# Patient Record
Sex: Female | Born: 1965 | Race: White | Hispanic: No | Marital: Married | Smoking: Former smoker
Health system: Southern US, Community
[De-identification: ages and names within clinical notes are randomized; demographics above are authoritative.]

## PROBLEM LIST (undated history)

## (undated) DIAGNOSIS — T7500XA Unspecified effects of lightning, initial encounter: Secondary | ICD-10-CM

## (undated) DIAGNOSIS — A64 Unspecified sexually transmitted disease: Secondary | ICD-10-CM

## (undated) DIAGNOSIS — C4491 Basal cell carcinoma of skin, unspecified: Secondary | ICD-10-CM

## (undated) HISTORY — PX: APPENDECTOMY: SHX54

## (undated) HISTORY — DX: Basal cell carcinoma of skin, unspecified: C44.91

## (undated) HISTORY — PX: OTHER SURGICAL HISTORY: SHX169

## (undated) HISTORY — DX: Unspecified sexually transmitted disease: A64

## (undated) HISTORY — DX: Unspecified effects of lightning, initial encounter: T75.00XA

## (undated) HISTORY — PX: CYST REMOVAL HAND: SHX6279

---
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
## 1990-09-02 HISTORY — PX: OOPHORECTOMY: SHX86

## 2003-04-08 ENCOUNTER — Other Ambulatory Visit: Admission: RE | Admit: 2003-04-08 | Discharge: 2003-04-08 | Payer: Self-pay | Admitting: Gynecology

## 2003-11-22 ENCOUNTER — Other Ambulatory Visit: Admission: RE | Admit: 2003-11-22 | Discharge: 2003-11-22 | Payer: Self-pay | Admitting: Gynecology

## 2004-11-23 ENCOUNTER — Other Ambulatory Visit: Admission: RE | Admit: 2004-11-23 | Discharge: 2004-11-23 | Payer: Self-pay | Admitting: Gynecology

## 2005-11-25 ENCOUNTER — Other Ambulatory Visit: Admission: RE | Admit: 2005-11-25 | Discharge: 2005-11-25 | Payer: Self-pay | Admitting: Gynecology

## 2006-02-06 ENCOUNTER — Ambulatory Visit (HOSPITAL_COMMUNITY): Payer: Self-pay | Admitting: *Deleted

## 2006-06-10 ENCOUNTER — Ambulatory Visit (HOSPITAL_COMMUNITY): Payer: Self-pay | Admitting: *Deleted

## 2006-07-18 ENCOUNTER — Ambulatory Visit (HOSPITAL_COMMUNITY): Payer: Self-pay | Admitting: *Deleted

## 2006-12-03 ENCOUNTER — Other Ambulatory Visit: Admission: RE | Admit: 2006-12-03 | Discharge: 2006-12-03 | Payer: Self-pay | Admitting: Gynecology

## 2007-12-04 ENCOUNTER — Other Ambulatory Visit: Admission: RE | Admit: 2007-12-04 | Discharge: 2007-12-04 | Payer: Self-pay | Admitting: Gynecology

## 2008-12-15 ENCOUNTER — Encounter: Payer: Self-pay | Admitting: Women's Health

## 2008-12-15 ENCOUNTER — Ambulatory Visit: Payer: Self-pay | Admitting: Women's Health

## 2008-12-15 ENCOUNTER — Other Ambulatory Visit: Admission: RE | Admit: 2008-12-15 | Discharge: 2008-12-15 | Payer: Self-pay | Admitting: Gynecology

## 2009-04-02 HISTORY — PX: ANKLE FRACTURE SURGERY: SHX122

## 2009-12-18 ENCOUNTER — Other Ambulatory Visit: Admission: RE | Admit: 2009-12-18 | Discharge: 2009-12-18 | Payer: Self-pay | Admitting: Gynecology

## 2009-12-18 ENCOUNTER — Ambulatory Visit: Payer: Self-pay | Admitting: Women's Health

## 2010-07-03 HISTORY — PX: EYE SURGERY: SHX253

## 2010-12-20 ENCOUNTER — Encounter: Payer: Self-pay | Admitting: Women's Health

## 2010-12-24 ENCOUNTER — Encounter (INDEPENDENT_AMBULATORY_CARE_PROVIDER_SITE_OTHER): Payer: BC Managed Care – PPO | Admitting: Women's Health

## 2010-12-24 ENCOUNTER — Other Ambulatory Visit (HOSPITAL_COMMUNITY)
Admission: RE | Admit: 2010-12-24 | Discharge: 2010-12-24 | Disposition: A | Discharge status: Home / Self Care | Payer: BC Managed Care – PPO | Source: Ambulatory Visit | Attending: Gynecology | Admitting: Gynecology

## 2010-12-24 ENCOUNTER — Other Ambulatory Visit: Payer: Self-pay | Admitting: Women's Health

## 2010-12-24 DIAGNOSIS — Z01419 Encounter for gynecological examination (general) (routine) without abnormal findings: Secondary | ICD-10-CM

## 2010-12-24 DIAGNOSIS — R82998 Other abnormal findings in urine: Secondary | ICD-10-CM

## 2010-12-24 DIAGNOSIS — E079 Disorder of thyroid, unspecified: Secondary | ICD-10-CM

## 2010-12-24 DIAGNOSIS — Z124 Encounter for screening for malignant neoplasm of cervix: Secondary | ICD-10-CM | POA: Insufficient documentation

## 2011-05-17 ENCOUNTER — Telehealth: Payer: Self-pay | Admitting: *Deleted

## 2011-05-17 NOTE — Telephone Encounter (Signed)
Pt called and asked about getting a chickenpox vaccine. She has never had chicken pox and travels the world, her nurse at work advised she get one.  I advised we dont do them here and to call her PCP or the health dept. She understood.

## 2011-07-04 ENCOUNTER — Other Ambulatory Visit: Payer: Self-pay | Admitting: Dermatology

## 2011-11-04 ENCOUNTER — Encounter: Payer: Self-pay | Admitting: Women's Health

## 2011-12-20 ENCOUNTER — Encounter: Payer: Self-pay | Admitting: Women's Health

## 2011-12-20 DIAGNOSIS — T7500XA Unspecified effects of lightning, initial encounter: Secondary | ICD-10-CM | POA: Insufficient documentation

## 2011-12-28 ENCOUNTER — Other Ambulatory Visit: Payer: Self-pay | Admitting: Women's Health

## 2011-12-30 ENCOUNTER — Ambulatory Visit (INDEPENDENT_AMBULATORY_CARE_PROVIDER_SITE_OTHER): Payer: BC Managed Care – PPO | Admitting: Women's Health

## 2011-12-30 ENCOUNTER — Encounter: Payer: Self-pay | Admitting: Women's Health

## 2011-12-30 VITALS — BP 150/94 | Ht 66.5 in | Wt 178.0 lb

## 2011-12-30 DIAGNOSIS — Z309 Encounter for contraceptive management, unspecified: Secondary | ICD-10-CM

## 2011-12-30 DIAGNOSIS — N912 Amenorrhea, unspecified: Secondary | ICD-10-CM

## 2011-12-30 DIAGNOSIS — Z01419 Encounter for gynecological examination (general) (routine) without abnormal findings: Secondary | ICD-10-CM

## 2011-12-30 DIAGNOSIS — N898 Other specified noninflammatory disorders of vagina: Secondary | ICD-10-CM

## 2011-12-30 DIAGNOSIS — IMO0001 Reserved for inherently not codable concepts without codable children: Secondary | ICD-10-CM

## 2011-12-30 DIAGNOSIS — B009 Herpesviral infection, unspecified: Secondary | ICD-10-CM

## 2011-12-30 LAB — WET PREP FOR TRICH, YEAST, CLUE: Clue Cells Wet Prep HPF POC: NONE SEEN

## 2011-12-30 MED ORDER — VALACYCLOVIR HCL 500 MG PO TABS: ORAL_TABLET | ORAL | Status: DC

## 2011-12-30 MED ORDER — FLUCONAZOLE 150 MG PO TABS: 150.0000 mg | ORAL_TABLET | Freq: Once | ORAL | Status: AC

## 2011-12-30 MED ORDER — NORETHINDRONE 0.35 MG PO TABS: 1.0000 | ORAL_TABLET | Freq: Every day | ORAL | Status: DC

## 2011-12-30 NOTE — Progress Notes (Signed)
Ann Hancock 01/23/1966 [id number]    History:    The patient presents for annual exam.  On [facility] [employer] for history of menorrhagia with periods of amenorrhea/husband vasectomy. Has recently had increased hot flushes, and less than 1 day cycle the last few months and last month no bleeding during the placebo week. Has had increased headaches and problems with constipation. Ann Hancock extensively internationally with job at [employer]. History of HSV 1, normal mammograms and Paps.  Past medical history, past surgical history, family history and social history were all reviewed and documented in the EPIC chart. Husband retired [employer]. History of epithelial corneal dystrophy diagnosed in 2010. History of removal of right ovary and fallopian tube for questionable ovarian torsion in 92.  ROS:  A  ROS was performed and pertinent positives and negatives are included in the history.  Exam:  Filed Vitals:   12/30/11 0810  BP: 150/94    General appearance:  Normal Head/Neck:  Normal, without cervical or supraclavicular adenopathy. Thyroid:  Symmetrical, normal in size, without palpable masses or nodularity. Respiratory  Effort:  Normal  Auscultation:  Clear without wheezing or rhonchi Cardiovascular  Auscultation:  Regular rate, without rubs, murmurs or gallops  Edema/varicosities:  Not grossly evident Abdominal  Soft,nontender, without masses, guarding or rebound.  Liver/spleen:  No organomegaly noted  Hernia:  None appreciated  Skin  Inspection:  Grossly normal  Palpation:  Grossly normal Neurologic/psychiatric  Orientation:  Normal with appropriate conversation.  Mood/affect:  Normal  Genitourinary    Breasts: Examined lying and sitting.     Right: Without masses, retractions, discharge or axillary adenopathy.     Left: Without masses, retractions, discharge or axillary adenopathy.   Inguinal/mons:  Normal without inguinal adenopathy  External genitalia:  Normal  BUS/Urethra/Skene's glands:   Normal  Bladder:  Normal  Vagina:  Normal  Cervix:  Normal  Uterus:   normal in size, shape and contour.  Midline and mobile  Adnexa/parametria:     Rt: Without masses or tenderness.   Lt: Without masses or tenderness.  Anus and perineum: Normal  Digital rectal exam: Normal sphincter tone without palpated masses or tenderness  Assessment/Plan:  46 y.o. [employer] G0  for annual exam with complaint of  vaginal itching and headaches.  Amenorrhea on Kariva Blood pressure 150/94 Overweight  Plan: Stop Kariva after current pack, Micronor 1 by mouth daily reviewed no placebo week, instructed to call if problems with bleeding. Instructed to followup with primary care for blood pressure management. Reviewed headaches may be caused by blood pressure elevations. Reviewed importance of decreasing calories, increasing exercise for weight loss. SBE's, annual mammogram, calcium rich diet, vitamin D 1000 daily encouraged. History of all normal Paps, ACOG's current pap recommendations for screenings reviewed. CBC FSH, TSH, UA. Had a normal glucose and lipid panel at work. Wet prep was negative, prescription for Diflucan 150 to take if needed.    Ann Hancock [facility], 10:11 AM 12/30/2011

## 2011-12-30 NOTE — Patient Instructions (Signed)
Hypertension As your heart beats, it forces blood through your arteries. This force is your blood pressure. If the pressure is too high, it is called hypertension (HTN) or high blood pressure. HTN is dangerous because you may have it and not know it. High blood pressure may mean that your heart has to work harder to pump blood. Your arteries may be narrow or stiff. The extra work puts you at risk for heart disease, stroke, and other problems.  Blood pressure consists of two numbers, a higher number over a lower, 110/72, for example. It is stated as "110 over 72." The ideal is below 120 for the top number (systolic) and under 80 for the bottom (diastolic). Write down your blood pressure today. You should pay close attention to your blood pressure if you have certain conditions such as:  Heart failure.   Prior heart attack.   Diabetes   Chronic kidney disease.   Prior stroke.   Multiple risk factors for heart disease.  To see if you have HTN, your blood pressure should be measured while you are seated with your arm held at the level of the heart. It should be measured at least twice. A one-time elevated blood pressure reading (especially in the Emergency Department) does not mean that you need treatment. There may be conditions in which the blood pressure is different between your right and left arms. It is important to see your caregiver soon for a recheck. Most people have essential hypertension which means that there is not a specific cause. This type of high blood pressure may be lowered by changing lifestyle factors such as:  Stress.   Smoking.   Lack of exercise.   Excessive weight.   Drug/tobacco/alcohol use.   Eating less salt.  Most people do not have symptoms from high blood pressure until it has caused damage to the body. Effective treatment can often prevent, delay or reduce that damage. TREATMENT  When a cause has been identified, treatment for high blood pressure is  directed at the cause. There are a large number of medications to treat HTN. These fall into several categories, and your caregiver will help you select the medicines that are best for you. Medications may have side effects. You should review side effects with your caregiver. If your blood pressure stays high after you have made lifestyle changes or started on medicines,   Your medication(s) may need to be changed.   Other problems may need to be addressed.   Be certain you understand your prescriptions, and know how and when to take your medicine.   Be sure to follow up with your caregiver within the time frame advised (usually within two weeks) to have your blood pressure rechecked and to review your medications.   If you are taking more than one medicine to lower your blood pressure, make sure you know how and at what times they should be taken. Taking two medicines at the same time can result in blood pressure that is too low.  SEEK IMMEDIATE MEDICAL CARE IF:  You develop a severe headache, blurred or changing vision, or confusion.   You have unusual weakness or numbness, or a faint feeling.   You have severe chest or abdominal pain, vomiting, or breathing problems.  MAKE SURE YOU:   Understand these instructions.   Will watch your condition.   Will get help right away if you are not doing well or get worse.  Document Released: 08/19/2005 Document Revised: 08/08/2011 Document Reviewed:   04/08/2008 [employer] Patient Information 2012 [place], [place].Health Maintenance, Females A healthy lifestyle and preventative care can promote health and wellness.  Maintain regular health, dental, and eye exams.   Eat a healthy diet. Foods like vegetables, fruits, whole grains, low-fat dairy products, and lean protein foods contain the nutrients you need without too many calories. Decrease your intake of foods high in solid fats, added sugars, and salt. Get information about a proper diet from your  caregiver, if necessary.   Regular physical exercise is one of the most important things you can do for your health. Most adults should get at least 150 minutes of moderate-intensity exercise (any activity that increases your heart rate and causes you to sweat) each week. In addition, most adults need muscle-strengthening exercises on 2 or more days a week.    Maintain a healthy weight. The body mass index (BMI) is a screening tool to identify possible weight problems. It provides an estimate of body fat based on height and weight. Your caregiver can help determine your BMI, and can help you achieve or maintain a healthy weight. For adults 20 years and older:   A BMI below 18.5 is considered underweight.   A BMI of 18.5 to 24.9 is normal.   A BMI of 25 to 29.9 is considered overweight.   A BMI of 30 and above is considered obese.   Maintain normal blood lipids and cholesterol by exercising and minimizing your intake of saturated fat. Eat a balanced diet with plenty of fruits and vegetables. Blood tests for lipids and cholesterol should begin at age 33 and be repeated every 5 years. If your lipid or cholesterol levels are high, you are over 50, or you are a high risk for heart disease, you may need your cholesterol levels checked more frequently.Ongoing high lipid and cholesterol levels should be treated with medicines if diet and exercise are not effective.   If you smoke, find out from your caregiver how to quit. If you do not use tobacco, do not start.   If you are pregnant, do not drink alcohol. If you are breastfeeding, be very cautious about drinking alcohol. If you are not pregnant and choose to drink alcohol, do not exceed 1 drink per day. One drink is considered to be 12 ounces (355 mL) of beer, 5 ounces (148 mL) of wine, or 1.5 ounces (44 mL) of liquor.   Avoid use of street drugs. Do not share needles with anyone. Ask for help if you need support or instructions about stopping the use  of drugs.   High blood pressure causes heart disease and increases the risk of stroke. Blood pressure should be checked at least every 1 to 2 years. Ongoing high blood pressure should be treated with medicines, if weight loss and exercise are not effective.   If you are 26 to 45 years old, ask your caregiver if you should take aspirin to prevent strokes.   Diabetes screening involves taking a blood sample to check your fasting blood sugar level. This should be done once every 3 years, after age 37, if you are within normal weight and without risk factors for diabetes. Testing should be considered at a younger age or be carried out more frequently if you are overweight and have at least 1 risk factor for diabetes.   Breast cancer screening is essential preventative care for women. You should practice "breast self-awareness." This means understanding the normal appearance and feel of your breasts and may include breast  self-examination. Any changes detected, no matter how small, should be reported to a caregiver. Women in their 23s and 30s should have a clinical breast exam (CBE) by a caregiver as part of a regular health exam every 1 to 3 years. After age 31, women should have a CBE every year. Starting at age 39, women should consider having a mammogram (breast X-ray) every year. Women who have a family history of breast cancer should talk to their caregiver about genetic screening. Women at a high risk of breast cancer should talk to their caregiver about having an MRI and a mammogram every year.   The Pap test is a screening test for cervical cancer. Women should have a Pap test starting at age 36. Between ages 60 and 7, Pap tests should be repeated every 2 years. Beginning at age 86, you should have a Pap test every 3 years as long as the past 3 Pap tests have been normal. If you had a hysterectomy for a problem that was not cancer or a condition that could lead to cancer, then you no longer need Pap  tests. If you are between ages 67 and 27, and you have had normal Pap tests going back 10 years, you no longer need Pap tests. If you have had past treatment for cervical cancer or a condition that could lead to cancer, you need Pap tests and screening for cancer for at least 20 years after your treatment. If Pap tests have been discontinued, risk factors (such as a new sexual partner) need to be reassessed to determine if screening should be resumed. Some women have medical problems that increase the chance of getting cervical cancer. In these cases, your caregiver may recommend more frequent screening and Pap tests.   The human papillomavirus (HPV) test is an additional test that may be used for cervical cancer screening. The HPV test looks for the virus that can cause the cell changes on the cervix. The cells collected during the Pap test can be tested for HPV. The HPV test could be used to screen women aged 85 years and older, and should be used in women of any age who have unclear Pap test results. After the age of 29, women should have HPV testing at the same frequency as a Pap test.   Colorectal cancer can be detected and often prevented. Most routine colorectal cancer screening begins at the age of 43 and continues through age 77. However, your caregiver may recommend screening at an earlier age if you have risk factors for colon cancer. On a yearly basis, your caregiver may provide home test kits to check for hidden blood in the stool. Use of a small camera at the end of a tube, to directly examine the colon (sigmoidoscopy or colonoscopy), can detect the earliest forms of colorectal cancer. Talk to your caregiver about this at age 51, when routine screening begins. Direct examination of the colon should be repeated every 5 to 10 years through age 38, unless early forms of pre-cancerous polyps or small growths are found.   Hepatitis C blood testing is recommended for all people born from [birth year] through  1965 and any individual with known risks for hepatitis C.   Practice safe sex. Use condoms and avoid high-risk sexual practices to reduce the spread of sexually transmitted infections (STIs). Sexually active women aged 41 and younger should be checked for Chlamydia, which is a common sexually transmitted infection. Older women with new or multiple partners should also  be tested for Chlamydia. Testing for other STIs is recommended if you are sexually active and at increased risk.   Osteoporosis is a disease in which the bones lose minerals and strength with aging. This can result in serious bone fractures. The risk of osteoporosis can be identified using a bone density scan. Women ages 70 and over and women at risk for fractures or osteoporosis should discuss screening with their caregivers. Ask your caregiver whether you should be taking a calcium supplement or vitamin D to reduce the rate of osteoporosis.   Menopause can be associated with physical symptoms and risks. Hormone replacement therapy is available to decrease symptoms and risks. You should talk to your caregiver about whether hormone replacement therapy is right for you.   Use sunscreen with a sun protection factor (SPF) of 30 or greater. Apply sunscreen liberally and repeatedly throughout the day. You should seek shade when your shadow is shorter than you. Protect yourself by wearing long sleeves, pants, a wide-brimmed hat, and sunglasses year round, whenever you are outdoors.   Notify your caregiver of new moles or changes in moles, especially if there is a change in shape or color. Also notify your caregiver if a mole is larger than the size of a pencil eraser.   Stay current with your immunizations.  Document Released: 03/04/2011 Document Revised: 08/08/2011 Document Reviewed: 03/04/2011 [facility] Patient Information 2012 [place], [place].

## 2011-12-31 LAB — CBC
HCT: 44.4 % (ref 36.0–46.0)
MCV: 96.3 fL (ref 78.0–100.0)
RBC: 4.61 MIL/uL (ref 3.87–5.11)
WBC: 7.3 10*3/uL (ref 4.0–10.5)

## 2012-11-04 ENCOUNTER — Encounter: Payer: Self-pay | Admitting: Women's Health

## 2012-11-12 ENCOUNTER — Other Ambulatory Visit: Payer: Self-pay | Admitting: Women's Health

## 2012-12-30 ENCOUNTER — Ambulatory Visit (INDEPENDENT_AMBULATORY_CARE_PROVIDER_SITE_OTHER): Payer: BC Managed Care – PPO | Admitting: Women's Health

## 2012-12-30 ENCOUNTER — Encounter: Payer: Self-pay | Admitting: Women's Health

## 2012-12-30 ENCOUNTER — Other Ambulatory Visit (HOSPITAL_COMMUNITY)
Admission: RE | Admit: 2012-12-30 | Discharge: 2012-12-30 | Disposition: A | Discharge status: Home / Self Care | Payer: BC Managed Care – PPO | Source: Ambulatory Visit | Attending: Obstetrics and Gynecology | Admitting: Obstetrics and Gynecology

## 2012-12-30 VITALS — BP 122/76 | Ht 66.0 in | Wt 182.0 lb

## 2012-12-30 DIAGNOSIS — Z309 Encounter for contraceptive management, unspecified: Secondary | ICD-10-CM

## 2012-12-30 DIAGNOSIS — Z01419 Encounter for gynecological examination (general) (routine) without abnormal findings: Secondary | ICD-10-CM | POA: Insufficient documentation

## 2012-12-30 DIAGNOSIS — Z833 Family history of diabetes mellitus: Secondary | ICD-10-CM

## 2012-12-30 DIAGNOSIS — C4491 Basal cell carcinoma of skin, unspecified: Secondary | ICD-10-CM | POA: Insufficient documentation

## 2012-12-30 DIAGNOSIS — E079 Disorder of thyroid, unspecified: Secondary | ICD-10-CM

## 2012-12-30 DIAGNOSIS — B009 Herpesviral infection, unspecified: Secondary | ICD-10-CM

## 2012-12-30 DIAGNOSIS — Z13220 Encounter for screening for lipoid disorders: Secondary | ICD-10-CM

## 2012-12-30 DIAGNOSIS — IMO0001 Reserved for inherently not codable concepts without codable children: Secondary | ICD-10-CM

## 2012-12-30 LAB — CBC WITH DIFFERENTIAL/PLATELET
Lymphocytes Relative: 18 % (ref 12–46)
Lymphs Abs: 1.4 10*3/uL (ref 0.7–4.0)
Neutro Abs: 5.4 10*3/uL (ref 1.7–7.7)
Neutrophils Relative %: 68 % (ref 43–77)
Platelets: 280 10*3/uL (ref 150–400)
RBC: 4.67 MIL/uL (ref 3.87–5.11)
WBC: 7.8 10*3/uL (ref 4.0–10.5)

## 2012-12-30 LAB — LIPID PANEL
Cholesterol: 141 mg/dL (ref 0–200)
HDL: 50 mg/dL (ref >39)
Total CHOL/HDL Ratio: 2.8 Ratio
Triglycerides: 91 mg/dL (ref <150)

## 2012-12-30 LAB — COMPREHENSIVE METABOLIC PANEL
ALT: 20 U/L (ref 0–35)
CO2: 27 mEq/L (ref 19–32)
Chloride: 104 mEq/L (ref 96–112)
Sodium: 138 mEq/L (ref 135–145)
Total Bilirubin: 0.5 mg/dL (ref 0.3–1.2)
Total Protein: 6.8 g/dL (ref 6.0–8.3)

## 2012-12-30 MED ORDER — NORETHINDRONE 0.35 MG PO TABS: ORAL_TABLET | ORAL | Status: DC

## 2012-12-30 MED ORDER — VALACYCLOVIR HCL 500 MG PO TABS: ORAL_TABLET | ORAL | Status: DC

## 2012-12-30 NOTE — Patient Instructions (Signed)
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
## 2012-12-31 ENCOUNTER — Telehealth: Payer: Self-pay | Admitting: *Deleted

## 2012-12-31 DIAGNOSIS — IMO0001 Reserved for inherently not codable concepts without codable children: Secondary | ICD-10-CM

## 2012-12-31 LAB — URINALYSIS W MICROSCOPIC + REFLEX CULTURE
Bilirubin Urine: NEGATIVE
Glucose, UA: NEGATIVE mg/dL
Hgb urine dipstick: NEGATIVE
Protein, ur: NEGATIVE mg/dL
Urobilinogen, UA: 0.2 mg/dL (ref 0.0–1.0)

## 2012-12-31 MED ORDER — NORETHINDRONE 0.35 MG PO TABS: ORAL_TABLET | ORAL | Status: DC

## 2012-12-31 NOTE — Telephone Encounter (Signed)
Pt called stating she had no refill on her [name] birth control pills, 11 refills re-sent to pharmacy. Pt informed.

## 2013-05-06 ENCOUNTER — Ambulatory Visit (INDEPENDENT_AMBULATORY_CARE_PROVIDER_SITE_OTHER): Payer: BC Managed Care – PPO | Admitting: Women's Health

## 2013-05-06 ENCOUNTER — Encounter: Payer: Self-pay | Admitting: Women's Health

## 2013-05-06 DIAGNOSIS — N898 Other specified noninflammatory disorders of vagina: Secondary | ICD-10-CM

## 2013-05-06 DIAGNOSIS — R350 Frequency of micturition: Secondary | ICD-10-CM

## 2013-05-06 LAB — URINALYSIS W MICROSCOPIC + REFLEX CULTURE
Bilirubin Urine: NEGATIVE
Glucose, UA: NEGATIVE mg/dL
Hgb urine dipstick: NEGATIVE
Leukocytes, UA: NEGATIVE
Protein, ur: NEGATIVE mg/dL

## 2013-05-06 LAB — WET PREP FOR TRICH, YEAST, CLUE

## 2013-05-06 MED ORDER — MEDROXYPROGESTERONE ACETATE 10 MG PO TABS: ORAL_TABLET | ORAL | Status: DC

## 2013-05-06 NOTE — Progress Notes (Signed)
Patient ID: Ann Hancock, female   DOB: 27-Oct-1965, 47 y.o.   MRN: [id number] Presents with complaint of 3 week cycle on [name], mild external vaginal itching, and increased urinary frequency without pain or burning. History of amenorrhea when not on birth control pills, vasectomy, normal TSH and prolactin. Had blood pressure elevations on combination OCs in the past. Changed from branded Micronor to generic/[name] in April. First time  irregular bleeding. Has had occasional hot flushes.   Exam: Appears well. External genitalia within normal limits, speculum exam scant white discharge without odor or erythema, no blood noted. Wet prep negative, bimanual no CMT or adnexal fullness or tenderness. UA negative. BP 132/80.  Irregular bleeding on [name].  Plan: Stop [name], if becomes amenorrheic, Provera 10 mg for 5 days every 3 months. Instructed to call if irregular bleeding, or no bleeding after Provera.

## 2013-06-29 ENCOUNTER — Telehealth: Payer: Self-pay | Admitting: Women's Health

## 2013-06-29 NOTE — Telephone Encounter (Signed)
Telephone call from patient, had been on Micronor and stopped, was having some irregular spotting. History of amenorrhea for many years on Micronor. Amenorrheic when off OCs. Normal TSH and prolactin. Has had some blood pressure elevations on combination birth control pills in the past. Past week has had heavy bleeding, no more than 1 pad per hour, had been given a prescription for Provera 10 to take for 5 days if no cycle in 60 days. Vasectomy. Will take Provera 10 for 10 days instructed to call if bleeding does not stop.

## 2013-07-08 ENCOUNTER — Other Ambulatory Visit: Payer: Self-pay

## 2013-09-03 ENCOUNTER — Other Ambulatory Visit: Payer: Self-pay | Admitting: Dermatology

## 2013-10-18 ENCOUNTER — Other Ambulatory Visit: Payer: Self-pay

## 2013-10-18 DIAGNOSIS — Z1231 Encounter for screening mammogram for malignant neoplasm of breast: Secondary | ICD-10-CM

## 2013-11-02 ENCOUNTER — Other Ambulatory Visit: Payer: Self-pay | Admitting: Women's Health

## 2013-11-02 ENCOUNTER — Ambulatory Visit
Admission: RE | Admit: 2013-11-02 | Discharge: 2013-11-02 | Disposition: A | Discharge status: Home / Self Care | Payer: BC Managed Care – PPO | Source: Ambulatory Visit

## 2013-11-02 DIAGNOSIS — R928 Other abnormal and inconclusive findings on diagnostic imaging of breast: Secondary | ICD-10-CM

## 2013-11-02 DIAGNOSIS — Z1231 Encounter for screening mammogram for malignant neoplasm of breast: Secondary | ICD-10-CM

## 2013-11-16 ENCOUNTER — Encounter: Payer: Self-pay | Admitting: Women's Health

## 2013-11-16 ENCOUNTER — Ambulatory Visit
Admission: RE | Admit: 2013-11-16 | Discharge: 2013-11-16 | Disposition: A | Discharge status: Home / Self Care | Payer: BC Managed Care – PPO | Source: Ambulatory Visit | Attending: Women's Health | Admitting: Women's Health

## 2013-11-16 DIAGNOSIS — R928 Other abnormal and inconclusive findings on diagnostic imaging of breast: Secondary | ICD-10-CM

## 2013-12-31 ENCOUNTER — Encounter: Payer: Self-pay | Admitting: Women's Health

## 2013-12-31 ENCOUNTER — Other Ambulatory Visit: Payer: Self-pay | Admitting: Women's Health

## 2013-12-31 ENCOUNTER — Ambulatory Visit (INDEPENDENT_AMBULATORY_CARE_PROVIDER_SITE_OTHER): Payer: BC Managed Care – PPO | Admitting: Women's Health

## 2013-12-31 VITALS — BP 140/92 | Ht 66.0 in | Wt 183.0 lb

## 2013-12-31 DIAGNOSIS — B009 Herpesviral infection, unspecified: Secondary | ICD-10-CM

## 2013-12-31 DIAGNOSIS — Z01419 Encounter for gynecological examination (general) (routine) without abnormal findings: Secondary | ICD-10-CM

## 2013-12-31 DIAGNOSIS — Z13220 Encounter for screening for lipoid disorders: Secondary | ICD-10-CM

## 2013-12-31 DIAGNOSIS — N920 Excessive and frequent menstruation with regular cycle: Secondary | ICD-10-CM

## 2013-12-31 LAB — CBC WITH DIFFERENTIAL/PLATELET
Basophils Absolute: 0.1 10*3/uL (ref 0.0–0.1)
Basophils Relative: 1 % (ref 0–1)
EOS PCT: 2 % (ref 0–5)
Eosinophils Absolute: 0.1 10*3/uL (ref 0.0–0.7)
HCT: 42.8 % (ref 36.0–46.0)
HEMOGLOBIN: 14.8 g/dL (ref 12.0–15.0)
LYMPHS ABS: 1.3 10*3/uL (ref 0.7–4.0)
LYMPHS PCT: 19 % (ref 12–46)
MCH: 32.2 pg (ref 26.0–34.0)
MCHC: 34.6 g/dL (ref 30.0–36.0)
MCV: 93 fL (ref 78.0–100.0)
Monocytes Absolute: 0.7 10*3/uL (ref 0.1–1.0)
Monocytes Relative: 11 % (ref 3–12)
Neutro Abs: 4.5 10*3/uL (ref 1.7–7.7)
Neutrophils Relative %: 67 % (ref 43–77)
Platelets: 304 10*3/uL (ref 150–400)
RBC: 4.6 MIL/uL (ref 3.87–5.11)
RDW: 13.3 % (ref 11.5–15.5)
WBC: 6.7 10*3/uL (ref 4.0–10.5)

## 2013-12-31 LAB — COMPREHENSIVE METABOLIC PANEL
ALT: 19 U/L (ref 0–35)
AST: 18 U/L (ref 0–37)
Albumin: 4.4 g/dL (ref 3.5–5.2)
Alkaline Phosphatase: 73 U/L (ref 39–117)
BUN: 8 mg/dL (ref 6–23)
CHLORIDE: 102 meq/L (ref 96–112)
CO2: 29 mEq/L (ref 19–32)
Calcium: 9.2 mg/dL (ref 8.4–10.5)
Creat: 0.76 mg/dL (ref 0.50–1.10)
Glucose, Bld: 87 mg/dL (ref 70–99)
Potassium: 4.1 mEq/L (ref 3.5–5.3)
Sodium: 137 mEq/L (ref 135–145)
TOTAL PROTEIN: 6.8 g/dL (ref 6.0–8.3)
Total Bilirubin: 0.7 mg/dL (ref 0.2–1.2)

## 2013-12-31 LAB — LIPID PANEL
Cholesterol: 150 mg/dL (ref 0–200)
HDL: 59 mg/dL (ref >39)
LDL Cholesterol: 72 mg/dL (ref 0–99)
Total CHOL/HDL Ratio: 2.5 Ratio
Triglycerides: 94 mg/dL (ref <150)
VLDL: 19 mg/dL (ref 0–40)

## 2013-12-31 LAB — TSH: TSH: 2.109 u[IU]/mL (ref 0.350–4.500)

## 2013-12-31 MED ORDER — MEDROXYPROGESTERONE ACETATE 10 MG PO TABS: ORAL_TABLET | ORAL | Status: DC

## 2013-12-31 MED ORDER — NORETHINDRONE 0.35 MG PO TABS: 1.0000 | ORAL_TABLET | Freq: Every day | ORAL | Status: DC

## 2013-12-31 MED ORDER — VALACYCLOVIR HCL 500 MG PO TABS: ORAL_TABLET | ORAL | Status: DC

## 2013-12-31 MED ORDER — MEDROXYPROGESTERONE ACETATE 10 MG PO TABS: 10.0000 mg | ORAL_TABLET | Freq: Every day | ORAL | Status: DC
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
## 2013-12-31 NOTE — Patient Instructions (Signed)

## 2014-01-01 LAB — URINALYSIS W MICROSCOPIC + REFLEX CULTURE
BILIRUBIN URINE: NEGATIVE
CRYSTALS: NONE SEEN
Casts: NONE SEEN
Glucose, UA: NEGATIVE mg/dL
Ketones, ur: NEGATIVE mg/dL
Leukocytes, UA: NEGATIVE
Nitrite: NEGATIVE
PH: 6.5 (ref 5.0–8.0)
Protein, ur: NEGATIVE mg/dL
Specific Gravity, Urine: 1.005 (ref 1.005–1.030)
Squamous Epithelial / LPF: NONE SEEN
Urobilinogen, UA: 0.2 mg/dL (ref 0.0–1.0)

## 2014-01-01 LAB — PROLACTIN: Prolactin: 9 ng/mL

## 2014-01-02 ENCOUNTER — Encounter: Payer: Self-pay | Admitting: Women's Health

## 2014-02-11 ENCOUNTER — Ambulatory Visit: Payer: BC Managed Care – PPO | Admitting: Women's Health

## 2014-02-11 ENCOUNTER — Other Ambulatory Visit: Payer: BC Managed Care – PPO

## 2014-02-16 ENCOUNTER — Other Ambulatory Visit: Payer: BC Managed Care – PPO

## 2014-02-16 ENCOUNTER — Ambulatory Visit: Payer: BC Managed Care – PPO | Admitting: Women's Health

## 2014-10-21 ENCOUNTER — Other Ambulatory Visit: Payer: Self-pay

## 2014-10-21 DIAGNOSIS — Z1231 Encounter for screening mammogram for malignant neoplasm of breast: Secondary | ICD-10-CM

## 2014-11-04 ENCOUNTER — Ambulatory Visit
Admission: RE | Admit: 2014-11-04 | Discharge: 2014-11-04 | Disposition: A | Discharge status: Home / Self Care | Payer: BLUE CROSS/BLUE SHIELD | Source: Ambulatory Visit

## 2014-11-04 DIAGNOSIS — Z1231 Encounter for screening mammogram for malignant neoplasm of breast: Secondary | ICD-10-CM

## 2014-12-05 ENCOUNTER — Other Ambulatory Visit: Payer: Self-pay | Admitting: *Deleted

## 2014-12-05 DIAGNOSIS — B009 Herpesviral infection, unspecified: Secondary | ICD-10-CM

## 2014-12-05 MED ORDER — VALACYCLOVIR HCL 500 MG PO TABS: ORAL_TABLET | ORAL | Status: DC

## 2015-04-21 IMAGING — MG MM SCREENING BREAST TOMO BILATERAL
10 series · 10 of 26 positions shown · non-contrast
Comparison: Previous exam(s).

CLINICAL DATA: Screening.

EXAM:
DIGITAL SCREENING BILATERAL MAMMOGRAM WITH 3D TOMO WITH CAD

[L MLO]
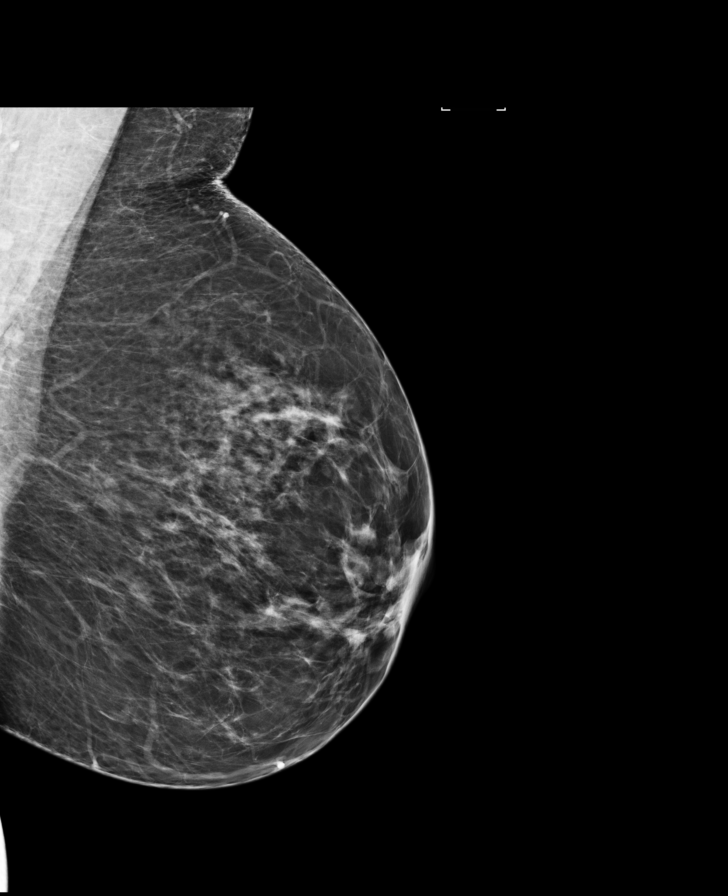

[R MLO]
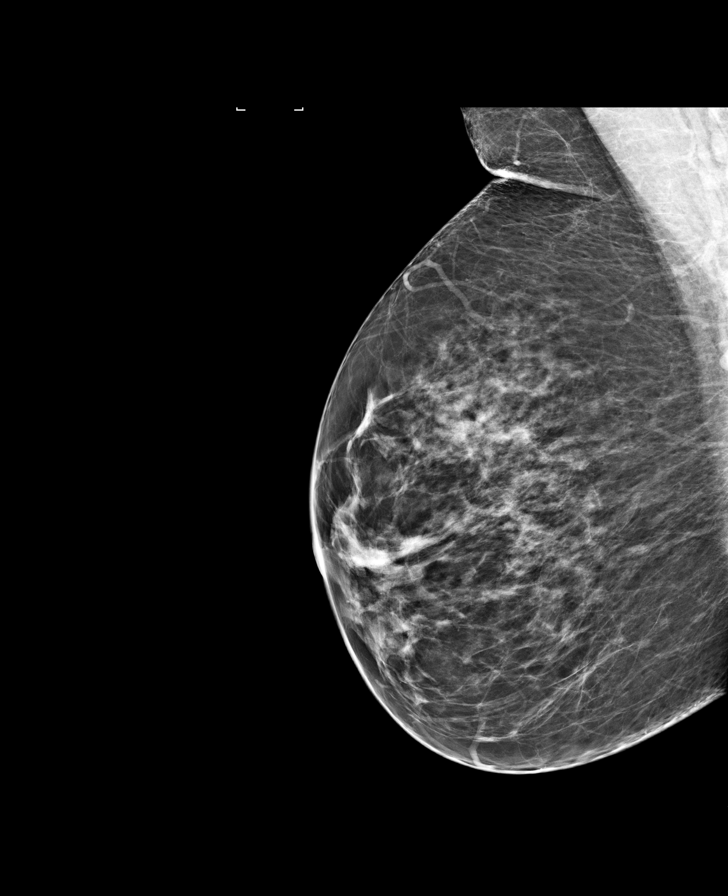

[R CC]
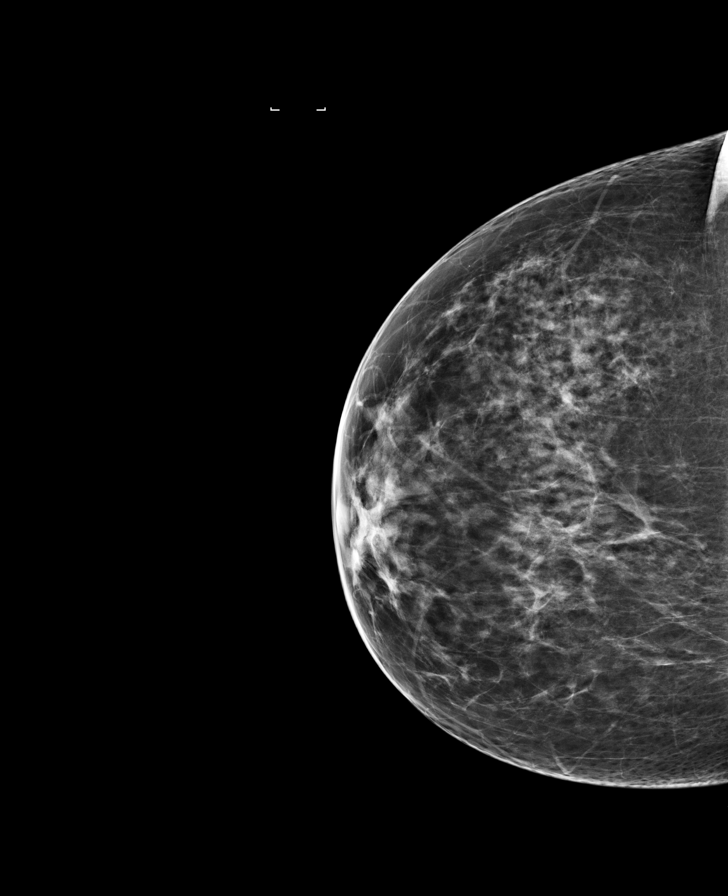

[L CC]
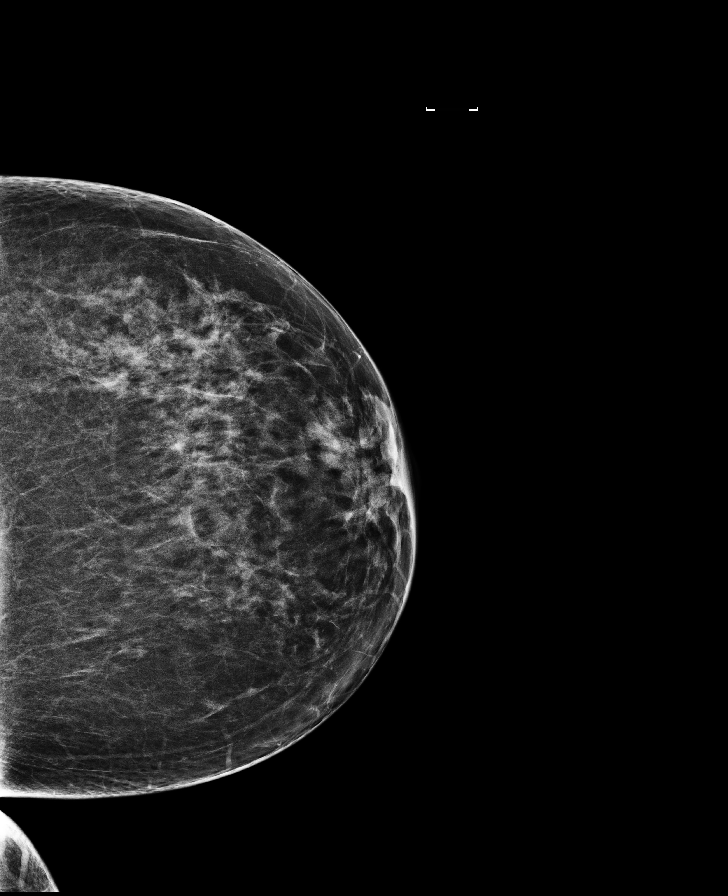

[R CC tomo (1 of 2)]
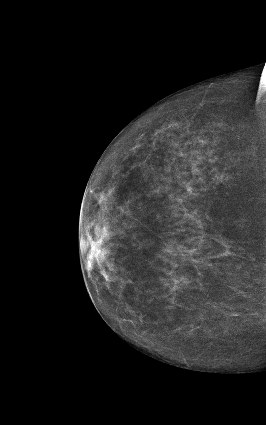

[R MLO tomo (1 of 2)]
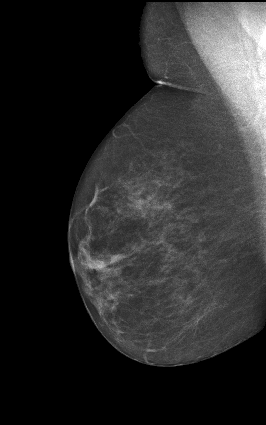

[R MLO tomo (2 of 2) · tomo slice 41/81.0]
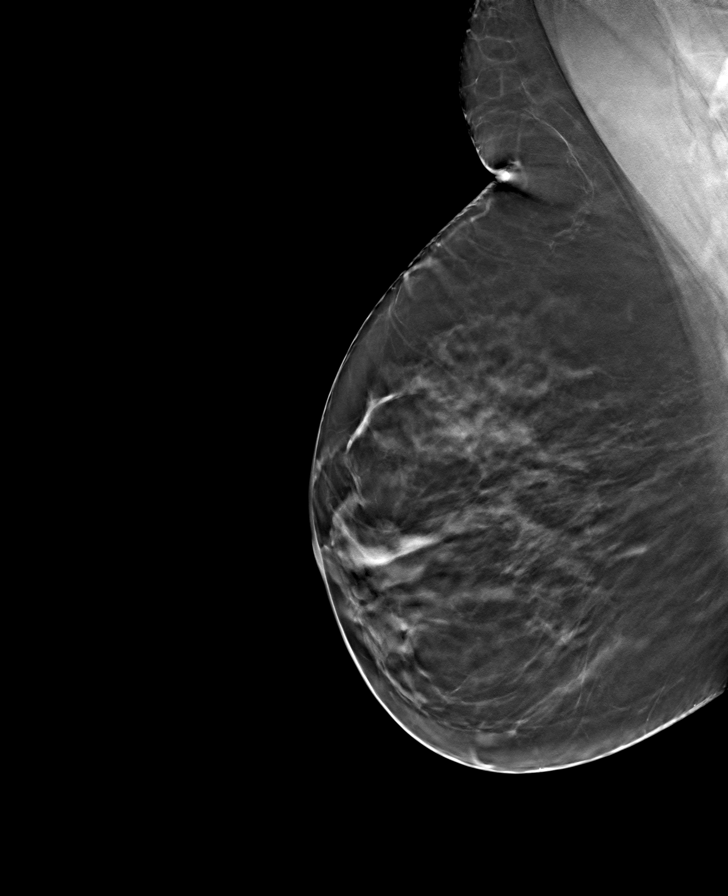

[R CC tomo (2 of 2) · tomo slice 37/72.0]
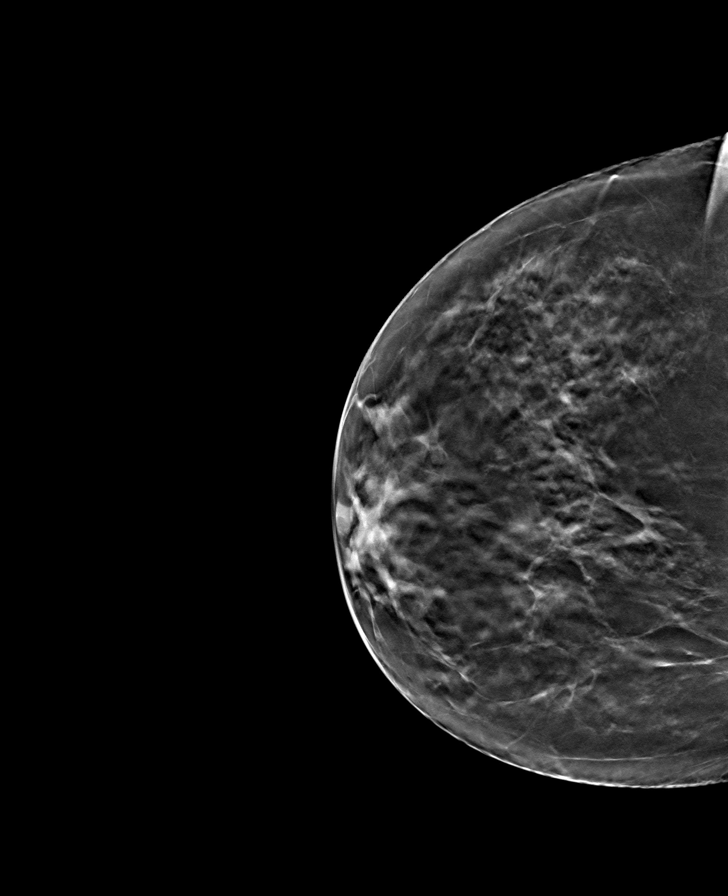

[L CC tomo · tomo slice 29/56.0]
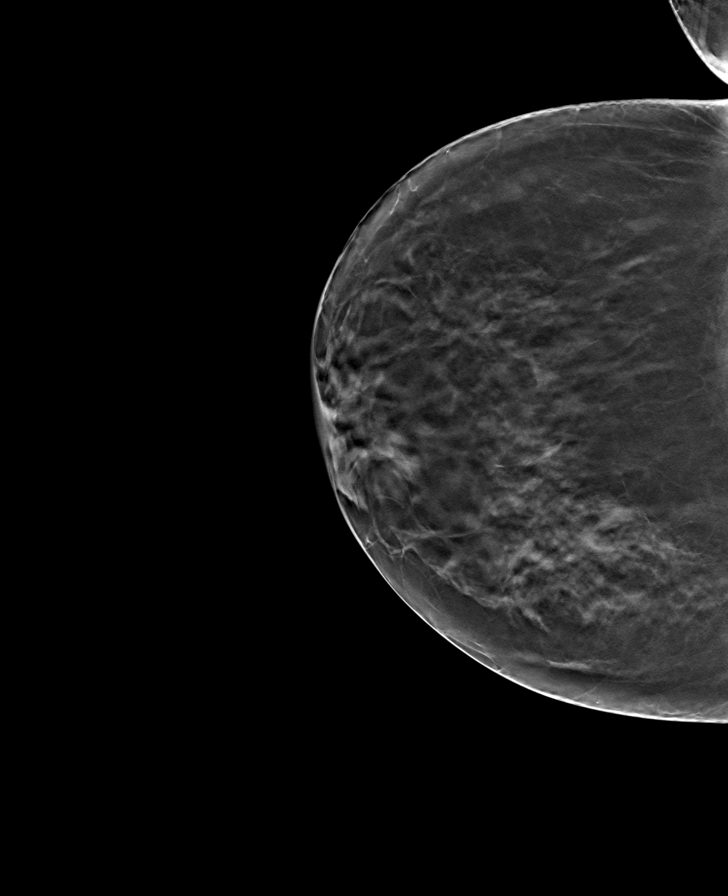

[L MLO tomo · tomo slice 39/78.0]
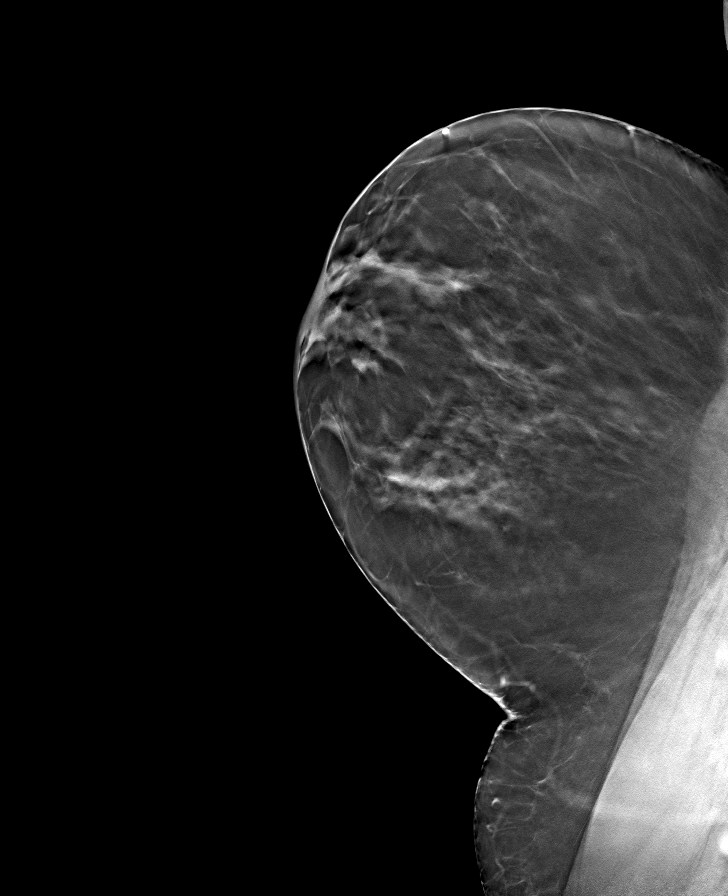

[10 of 26 positions shown; findings below may reference images not displayed]

ACR Breast Density Category c: The breast tissue is heterogeneously
dense, which may obscure small masses.
FINDINGS: There are no findings suspicious for malignancy. Images were
processed with CAD.
IMPRESSION: No mammographic evidence of malignancy. A result letter of this
screening mammogram will be mailed directly to the patient.

RECOMMENDATION:
Screening mammogram in one year. (Code:OA-G-1SS)

BI-RADS CATEGORY  1: Negative.

## 2015-10-10 ENCOUNTER — Encounter: Payer: Self-pay | Admitting: Rehabilitative and Restorative Service Providers"

## 2015-10-10 ENCOUNTER — Ambulatory Visit (INDEPENDENT_AMBULATORY_CARE_PROVIDER_SITE_OTHER): Payer: BLUE CROSS/BLUE SHIELD | Admitting: Rehabilitative and Restorative Service Providers"

## 2015-10-10 DIAGNOSIS — M545 Low back pain, unspecified: Secondary | ICD-10-CM

## 2015-10-10 DIAGNOSIS — R293 Abnormal posture: Secondary | ICD-10-CM

## 2015-10-10 DIAGNOSIS — Z7409 Other reduced mobility: Secondary | ICD-10-CM | POA: Diagnosis not present

## 2015-10-10 DIAGNOSIS — M623 Immobility syndrome (paraplegic): Secondary | ICD-10-CM | POA: Diagnosis not present

## 2015-10-10 DIAGNOSIS — M2560 Stiffness of unspecified joint, not elsewhere classified: Secondary | ICD-10-CM
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
## 2015-10-10 NOTE — Patient Instructions (Signed)
Abdominal Bracing With Pelvic Floor (Hook-Lying)    With neutral spine, tighten pelvic floor and abdominals sucking belly button to back bone; tighten muscles in back at waist. Hold 10 sec. Repeat _10_ times. Do _several__ times a day. Progress to do this exercise sitting/standing/walking and with functional activities.   Trunk: Prone Extension (Press-Ups)    Lie on stomach on firm, flat surface. Relax bottom and legs. Raise chest in air with elbows straight. Keep hips flat on surface, sag stomach. Hold __2-3__ seconds. Repeat ___10_ times. Do __2-3__ sessions per day. CAUTION: Movement should be gentle and slow.  Trunk Extension    Standing, place back of open hands on low back. Straighten spine then arch the back and move shoulders back. Repeat ___2-3_ times per session. Do __4-5 times/day  HIP: Hamstrings - Supine  (can bend opposite knee to protect spine) Place strap around foot. Raise leg up, keeping knee straight.  Bend opposite knee to protect back if indicated. Hold 30 seconds. 3 reps per set, 2-3 sets per day  Knee to chest    Bring one knee toward chest. Hold _20___ seconds. Repeat __3__ times per set. Do __2__ sessions per day.   Piriformis Stretch   Lying on back, pull right knee toward opposite shoulder. Hold 30 seconds. Repeat 3 times. Do 2-3 sessions per day.   Quads / HF, Prone   Lie face down. Grasp one ankle with same-side hand. Use towel if needed to reach. Gently pull foot toward buttock.  Hold 30 seconds. Repeat 3 times per session. Do 2-3 sessions per day.   TENS UNIT: This is helpful for muscle pain and spasm.   Search and Purchase a TENS 7000 2nd edition at www.tenspros.com. It should be less than $30.     TENS unit instructions: Do not shower or bathe with the unit on Turn the unit off before removing electrodes or batteries If the electrodes lose stickiness add a drop of water to the electrodes after they are disconnected from  the unit and place on plastic sheet. If you continued to have difficulty, call the TENS unit company to purchase more electrodes. Do not apply lotion on the skin area prior to use. Make sure the skin is clean and dry as this will help prolong the life of the electrodes. After use, always check skin for unusual red areas, rash or other skin difficulties. If there are any skin problems, does not apply electrodes to the same area. Never remove the electrodes from the unit by pulling the wires. Do not use the TENS unit or electrodes other than as directed. Do not change electrode placement without consultating your therapist or physician. Keep 2 fingers with between each electrode.

## 2015-10-12 ENCOUNTER — Ambulatory Visit (INDEPENDENT_AMBULATORY_CARE_PROVIDER_SITE_OTHER): Payer: BLUE CROSS/BLUE SHIELD | Admitting: Physical Therapy

## 2015-10-12 ENCOUNTER — Encounter: Payer: Self-pay | Admitting: Physical Therapy

## 2015-10-12 DIAGNOSIS — Z7409 Other reduced mobility: Secondary | ICD-10-CM | POA: Diagnosis not present

## 2015-10-12 DIAGNOSIS — R293 Abnormal posture: Secondary | ICD-10-CM

## 2015-10-12 DIAGNOSIS — M623 Immobility syndrome (paraplegic): Secondary | ICD-10-CM | POA: Diagnosis not present

## 2015-10-12 DIAGNOSIS — M2560 Stiffness of unspecified joint, not elsewhere classified: Secondary | ICD-10-CM

## 2015-10-12 DIAGNOSIS — M545 Low back pain, unspecified: Secondary | ICD-10-CM
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
## 2015-10-12 NOTE — Patient Instructions (Signed)

## 2015-10-17 ENCOUNTER — Ambulatory Visit (INDEPENDENT_AMBULATORY_CARE_PROVIDER_SITE_OTHER): Payer: BLUE CROSS/BLUE SHIELD | Admitting: Rehabilitative and Restorative Service Providers"

## 2015-10-17 ENCOUNTER — Encounter: Payer: Self-pay | Admitting: Rehabilitative and Restorative Service Providers"

## 2015-10-17 DIAGNOSIS — M545 Low back pain, unspecified: Secondary | ICD-10-CM

## 2015-10-17 DIAGNOSIS — R293 Abnormal posture: Secondary | ICD-10-CM

## 2015-10-17 DIAGNOSIS — M2560 Stiffness of unspecified joint, not elsewhere classified: Secondary | ICD-10-CM

## 2015-10-17 DIAGNOSIS — M623 Immobility syndrome (paraplegic): Secondary | ICD-10-CM

## 2015-10-17 DIAGNOSIS — Z7409 Other reduced mobility: Secondary | ICD-10-CM | POA: Diagnosis not present
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
## 2015-10-17 NOTE — Patient Instructions (Signed)
Angry Cat Stretch    Tuck chin and tighten stomach, arching back. Repeat __5__ times per set. Do __1-2__ sets per session. Do _2-3___ sessions per day.    Combination (Hook-Lying)    Tighten stomach and slowly raise left leg and lower opposite arm over head. Keep trunk rigid. Repeat _10___ times per set. Do __2-3__ sessions per day.  Piriformis Stretch    Lying on back, pull right knee toward opposite shoulder. Hold _20-30___ seconds. Repeat __3__ times. Do __2-3__ sessions per day.   HIP: Hamstrings - Supine    Place strap around foot. Raise leg up, keep knee straight. Hold __30_ seconds. _3__ reps per set, __2-3_ times per day

## 2015-10-19 ENCOUNTER — Ambulatory Visit (INDEPENDENT_AMBULATORY_CARE_PROVIDER_SITE_OTHER): Payer: BLUE CROSS/BLUE SHIELD | Admitting: Physical Therapy

## 2015-10-19 ENCOUNTER — Encounter: Payer: Self-pay | Admitting: Physical Therapy

## 2015-10-19 DIAGNOSIS — R293 Abnormal posture: Secondary | ICD-10-CM | POA: Diagnosis not present

## 2015-10-19 DIAGNOSIS — M545 Low back pain, unspecified: Secondary | ICD-10-CM

## 2015-10-19 DIAGNOSIS — M623 Immobility syndrome (paraplegic): Secondary | ICD-10-CM | POA: Diagnosis not present

## 2015-10-19 DIAGNOSIS — Z7409 Other reduced mobility: Secondary | ICD-10-CM | POA: Diagnosis not present

## 2015-10-19 DIAGNOSIS — M2560 Stiffness of unspecified joint, not elsewhere classified: Secondary | ICD-10-CM
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
## 2015-10-19 NOTE — Patient Instructions (Signed)
Adductor Stretch: Reclined (Strap, Wall)    Warm up with leg vertical. Rotate leg to side and fix foot to wall. Anchor opposite hip. Hold for __10__ breaths. Repeat _1-2___ times each leg.   Outer Hip Stretch: Reclined IT Band Stretch (Strap)    Strap around opposite foot, pull across only as far as possible with shoulders on mat. Hold for _10___ breaths. Repeat __1-2__ times each leg.  Copyright  VHI. All rights reserved.

## 2015-10-23 ENCOUNTER — Ambulatory Visit (INDEPENDENT_AMBULATORY_CARE_PROVIDER_SITE_OTHER): Payer: BLUE CROSS/BLUE SHIELD | Admitting: Physical Therapy

## 2015-10-23 DIAGNOSIS — M545 Low back pain, unspecified: Secondary | ICD-10-CM

## 2015-10-23 DIAGNOSIS — Z7409 Other reduced mobility: Secondary | ICD-10-CM

## 2015-10-23 DIAGNOSIS — M623 Immobility syndrome (paraplegic): Secondary | ICD-10-CM

## 2015-10-23 DIAGNOSIS — R293 Abnormal posture: Secondary | ICD-10-CM | POA: Diagnosis not present

## 2015-10-23 DIAGNOSIS — M2560 Stiffness of unspecified joint, not elsewhere classified: Secondary | ICD-10-CM

## 2015-10-23 NOTE — Patient Instructions (Signed)
  Abdominal Bracing With Pelvic Floor (Hook-Lying)   With neutral spine, tighten pelvic floor and abdominals. Hold 10 seconds. Repeat __10_ times. Do _1__ times a day.   Knee to Chest: Transverse Plane Stability   Bring one knee up, then return. Be sure pelvis does not roll side to side. Keep pelvis still. Lift knee __10_ times each leg. Restabilize pelvis. Repeat with other leg. Do _1-2__ sets, _1__ times per day.    Hip External Rotation With Pillow: Transverse Plane Stability   One knee bent, one leg straight, on pillow. Slowly roll bent knee out. Be sure pelvis does not rotate. Do _10__ times. Restabilize pelvis. Repeat with other leg. Do _1-2__ sets, _1__ times per day.  Heel Slide: 4-10 Inches - Transverse Plane Stability   Slide heel 4 inches down. Be sure pelvis does not rotate. Do _10__ times. Restabilize pelvis. Repeat with other leg. Do __1_ sets, _1__ times per day.  Seated Alternating Leg Raise (Marching)    Sit on ball/chair. Engage core muscles. Raise bent knee and return. Repeat with other leg. Do 21__ sets of _10__ repetitions.  [facility] [place], [place] [address]  [phone] (office) [phone] (fax)
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
## 2015-10-26 ENCOUNTER — Encounter: Payer: Self-pay | Admitting: Physical Therapy

## 2015-10-26 ENCOUNTER — Ambulatory Visit (INDEPENDENT_AMBULATORY_CARE_PROVIDER_SITE_OTHER): Payer: BLUE CROSS/BLUE SHIELD | Admitting: Physical Therapy

## 2015-10-26 DIAGNOSIS — Z7409 Other reduced mobility: Secondary | ICD-10-CM | POA: Diagnosis not present

## 2015-10-26 DIAGNOSIS — M623 Immobility syndrome (paraplegic): Secondary | ICD-10-CM

## 2015-10-26 DIAGNOSIS — M545 Low back pain, unspecified: Secondary | ICD-10-CM

## 2015-10-26 DIAGNOSIS — R293 Abnormal posture: Secondary | ICD-10-CM

## 2015-10-26 DIAGNOSIS — M2560 Stiffness of unspecified joint, not elsewhere classified: Secondary | ICD-10-CM
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
## 2015-10-31 ENCOUNTER — Encounter: Payer: BLUE CROSS/BLUE SHIELD | Admitting: Physical Therapy

## 2015-11-02 ENCOUNTER — Ambulatory Visit (INDEPENDENT_AMBULATORY_CARE_PROVIDER_SITE_OTHER): Payer: BLUE CROSS/BLUE SHIELD | Admitting: Physical Therapy

## 2015-11-02 DIAGNOSIS — R293 Abnormal posture: Secondary | ICD-10-CM | POA: Diagnosis not present

## 2015-11-02 DIAGNOSIS — M545 Low back pain, unspecified: Secondary | ICD-10-CM

## 2015-11-02 DIAGNOSIS — Z7409 Other reduced mobility: Secondary | ICD-10-CM | POA: Diagnosis not present

## 2015-11-02 DIAGNOSIS — M623 Immobility syndrome (paraplegic): Secondary | ICD-10-CM | POA: Diagnosis not present

## 2015-11-02 DIAGNOSIS — M2560 Stiffness of unspecified joint, not elsewhere classified: Secondary | ICD-10-CM
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
## 2015-11-06 ENCOUNTER — Ambulatory Visit (INDEPENDENT_AMBULATORY_CARE_PROVIDER_SITE_OTHER): Payer: BLUE CROSS/BLUE SHIELD | Admitting: Physical Therapy

## 2015-11-06 DIAGNOSIS — Z7409 Other reduced mobility: Secondary | ICD-10-CM | POA: Diagnosis not present

## 2015-11-06 DIAGNOSIS — M2560 Stiffness of unspecified joint, not elsewhere classified: Secondary | ICD-10-CM

## 2015-11-06 DIAGNOSIS — M623 Immobility syndrome (paraplegic): Secondary | ICD-10-CM | POA: Diagnosis not present

## 2015-11-06 DIAGNOSIS — M545 Low back pain, unspecified: Secondary | ICD-10-CM

## 2015-11-06 DIAGNOSIS — R293 Abnormal posture: Secondary | ICD-10-CM | POA: Diagnosis not present
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
## 2015-11-09 ENCOUNTER — Encounter: Payer: BLUE CROSS/BLUE SHIELD | Admitting: Physical Therapy

## 2015-11-13 ENCOUNTER — Encounter: Payer: BLUE CROSS/BLUE SHIELD | Admitting: Rehabilitative and Restorative Service Providers"

## 2016-04-10 ENCOUNTER — Encounter: Payer: Self-pay | Admitting: Women's Health

## 2016-04-17 ENCOUNTER — Ambulatory Visit (INDEPENDENT_AMBULATORY_CARE_PROVIDER_SITE_OTHER): Payer: BLUE CROSS/BLUE SHIELD | Admitting: Women's Health

## 2016-04-17 ENCOUNTER — Encounter: Payer: Self-pay | Admitting: Women's Health

## 2016-04-17 VITALS — BP 140/86 | Ht 66.0 in | Wt 190.0 lb

## 2016-04-17 DIAGNOSIS — B009 Herpesviral infection, unspecified: Secondary | ICD-10-CM

## 2016-04-17 DIAGNOSIS — N898 Other specified noninflammatory disorders of vagina: Secondary | ICD-10-CM

## 2016-04-17 DIAGNOSIS — N951 Menopausal and female climacteric states: Secondary | ICD-10-CM | POA: Diagnosis not present

## 2016-04-17 DIAGNOSIS — Z01419 Encounter for gynecological examination (general) (routine) without abnormal findings: Secondary | ICD-10-CM

## 2016-04-17 DIAGNOSIS — Z23 Encounter for immunization: Secondary | ICD-10-CM | POA: Diagnosis not present

## 2016-04-17 LAB — WET PREP FOR TRICH, YEAST, CLUE
CLUE CELLS WET PREP: NONE SEEN
Trich, Wet Prep: NONE SEEN
WBC, Wet Prep HPF POC: NONE SEEN
YEAST WET PREP: NONE SEEN

## 2016-04-17 MED ORDER — MEDROXYPROGESTERONE ACETATE 10 MG PO TABS: ORAL_TABLET | ORAL | 4 refills | Status: DC

## 2016-04-17 MED ORDER — VALACYCLOVIR HCL 500 MG PO TABS: ORAL_TABLET | ORAL | 4 refills | Status: DC
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
## 2016-04-17 NOTE — Patient Instructions (Signed)
Health Maintenance, Female Adopting a healthy lifestyle and getting preventive care can go a long way to promote health and wellness. Talk with your health care provider about what schedule of regular examinations is right for you. This is a good chance for you to check in with your provider about disease prevention and staying healthy. In between checkups, there are plenty of things you can do on your own. Experts have done a lot of research about which lifestyle changes and preventive measures are most likely to keep you healthy. Ask your health care provider for more information. WEIGHT AND DIET  Eat a healthy diet  Be sure to include plenty of vegetables, fruits, low-fat dairy products, and lean protein.  Do not eat a lot of foods high in solid fats, added sugars, or salt.  Get regular exercise. This is one of the most important things you can do for your health.  Most adults should exercise for at least 150 minutes each week. The exercise should increase your heart rate and make you sweat (moderate-intensity exercise).  Most adults should also do strengthening exercises at least twice a week. This is in addition to the moderate-intensity exercise.  Maintain a healthy weight  Body mass index (BMI) is a measurement that can be used to identify possible weight problems. It estimates body fat based on height and weight. Your health care provider can help determine your BMI and help you achieve or maintain a healthy weight.  For females 20 years of age and older:   A BMI below 18.5 is considered underweight.  A BMI of 18.5 to 24.9 is normal.  A BMI of 25 to 29.9 is considered overweight.  A BMI of 30 and above is considered obese.  Watch levels of cholesterol and blood lipids  You should start having your blood tested for lipids and cholesterol at 49 years of age, then have this test every 5 years.  You may need to have your cholesterol levels checked more often if:  Your lipid  or cholesterol levels are high.  You are older than 50 years of age.  You are at high risk for heart disease.  CANCER SCREENING   Lung Cancer  Lung cancer screening is recommended for adults 55-80 years old who are at high risk for lung cancer because of a history of smoking.  A yearly low-dose CT scan of the lungs is recommended for people who:  Currently smoke.  Have quit within the past 15 years.  Have at least a 30-pack-year history of smoking. A pack year is smoking an average of one pack of cigarettes a day for 1 year.  Yearly screening should continue until it has been 15 years since you quit.  Yearly screening should stop if you develop a health problem that would prevent you from having lung cancer treatment.  Breast Cancer  Practice breast self-awareness. This means understanding how your breasts normally appear and feel.  It also means doing regular breast self-exams. Let your health care provider know about any changes, no matter how small.  If you are in your 20s or 30s, you should have a clinical breast exam (CBE) by a health care provider every 1-3 years as part of a regular health exam.  If you are 40 or older, have a CBE every year. Also consider having a breast X-ray (mammogram) every year.  If you have a family history of breast cancer, talk to your health care provider about genetic screening.  If you   are at high risk for breast cancer, talk to your health care provider about having an MRI and a mammogram every year.  Breast cancer gene (BRCA) assessment is recommended for women who have family members with BRCA-related cancers. BRCA-related cancers include:  Breast.  Ovarian.  Tubal.  Peritoneal cancers.  Results of the assessment will determine the need for genetic counseling and BRCA1 and BRCA2 testing. Cervical Cancer Your health care provider may recommend that you be screened regularly for cancer of the pelvic organs (ovaries, uterus, and  vagina). This screening involves a pelvic examination, including checking for microscopic changes to the surface of your cervix (Pap test). You may be encouraged to have this screening done every 3 years, beginning at age 21.  For women ages 30-65, health care providers may recommend pelvic exams and Pap testing every 3 years, or they may recommend the Pap and pelvic exam, combined with testing for human papilloma virus (HPV), every 5 years. Some types of HPV increase your risk of cervical cancer. Testing for HPV may also be done on women of any age with unclear Pap test results.  Other health care providers may not recommend any screening for nonpregnant women who are considered low risk for pelvic cancer and who do not have symptoms. Ask your health care provider if a screening pelvic exam is right for you.  If you have had past treatment for cervical cancer or a condition that could lead to cancer, you need Pap tests and screening for cancer for at least 20 years after your treatment. If Pap tests have been discontinued, your risk factors (such as having a new sexual partner) need to be reassessed to determine if screening should resume. Some women have medical problems that increase the chance of getting cervical cancer. In these cases, your health care provider may recommend more frequent screening and Pap tests. Colorectal Cancer  This type of cancer can be detected and often prevented.  Routine colorectal cancer screening usually begins at 50 years of age and continues through 49 years of age.  Your health care provider may recommend screening at an earlier age if you have risk factors for colon cancer.  Your health care provider may also recommend using home test kits to check for hidden blood in the stool.  A small camera at the end of a tube can be used to examine your colon directly (sigmoidoscopy or colonoscopy). This is done to check for the earliest forms of colorectal  cancer.  Routine screening usually begins at age 50.  Direct examination of the colon should be repeated every 5-10 years through 49 years of age. However, you may need to be screened more often if early forms of precancerous polyps or small growths are found. Skin Cancer  Check your skin from head to toe regularly.  Tell your health care provider about any new moles or changes in moles, especially if there is a change in a mole's shape or color.  Also tell your health care provider if you have a mole that is larger than the size of a pencil eraser.  Always use sunscreen. Apply sunscreen liberally and repeatedly throughout the day.  Protect yourself by wearing long sleeves, pants, a wide-brimmed hat, and sunglasses whenever you are outside. HEART DISEASE, DIABETES, AND HIGH BLOOD PRESSURE   High blood pressure causes heart disease and increases the risk of stroke. High blood pressure is more likely to develop in:  People who have blood pressure in the high end   of the normal range (130-139/85-89 mm Hg).  People who are overweight or obese.  People who are African American.  If you are 38-23 years of age, have your blood pressure checked every 3-5 years. If you are 61 years of age or older, have your blood pressure checked every year. You should have your blood pressure measured twice--once when you are at a hospital or clinic, and once when you are not at a hospital or clinic. Record the average of the two measurements. To check your blood pressure when you are not at a hospital or clinic, you can use:  An automated blood pressure machine at a pharmacy.  A home blood pressure monitor.  If you are between 45 years and 39 years old, ask your health care provider if you should take aspirin to prevent strokes.  Have regular diabetes screenings. This involves taking a blood sample to check your fasting blood sugar level.  If you are at a normal weight and have a low risk for diabetes,  have this test once every three years after 49 years of age.  If you are overweight and have a high risk for diabetes, consider being tested at a younger age or more often. PREVENTING INFECTION  Hepatitis B  If you have a higher risk for hepatitis B, you should be screened for this virus. You are considered at high risk for hepatitis B if:  You were born in a country where hepatitis B is common. Ask your health care provider which countries are considered high risk.  Your parents were born in a high-risk country, and you have not been immunized against hepatitis B (hepatitis B vaccine).  You have HIV or AIDS.  You use needles to inject street drugs.  You live with someone who has hepatitis B.  You have had sex with someone who has hepatitis B.  You get hemodialysis treatment.  You take certain medicines for conditions, including cancer, organ transplantation, and autoimmune conditions. Hepatitis C  Blood testing is recommended for:  Everyone born from [birth year] through 1964.  Anyone with known risk factors for hepatitis C. Sexually transmitted infections (STIs)  You should be screened for sexually transmitted infections (STIs) including gonorrhea and chlamydia if:  You are sexually active and are younger than 49 years of age.  You are older than 49 years of age and your health care provider tells you that you are at risk for this type of infection.  Your sexual activity has changed since you were last screened and you are at an increased risk for chlamydia or gonorrhea. Ask your health care provider if you are at risk.  If you do not have HIV, but are at risk, it may be recommended that you take a prescription medicine daily to prevent HIV infection. This is called pre-exposure prophylaxis (PrEP). You are considered at risk if:  You are sexually active and do not regularly use condoms or know the HIV status of your partner(s).  You take drugs by injection.  You are sexually  active with a partner who has HIV. Talk with your health care provider about whether you are at high risk of being infected with HIV. If you choose to begin PrEP, you should first be tested for HIV. You should then be tested every 3 months for as long as you are taking PrEP.  PREGNANCY   If you are premenopausal and you may become pregnant, ask your health care provider about preconception counseling.  If you may  become pregnant, take 400 to 800 micrograms (mcg) of folic acid every day.  If you want to prevent pregnancy, talk to your health care provider about birth control (contraception). OSTEOPOROSIS AND MENOPAUSE   Osteoporosis is a disease in which the bones lose minerals and strength with aging. This can result in serious bone fractures. Your risk for osteoporosis can be identified using a bone density scan.  If you are 61 years of age or older, or if you are at risk for osteoporosis and fractures, ask your health care provider if you should be screened.  Ask your health care provider whether you should take a calcium or vitamin D supplement to lower your risk for osteoporosis.  Menopause may have certain physical symptoms and risks.  Hormone replacement therapy may reduce some of these symptoms and risks. Talk to your health care provider about whether hormone replacement therapy is right for you.  HOME CARE INSTRUCTIONS   Schedule regular health, dental, and eye exams.  Stay current with your immunizations.   Do not use any tobacco products including cigarettes, chewing tobacco, or electronic cigarettes.  If you are pregnant, do not drink alcohol.  If you are breastfeeding, limit how much and how often you drink alcohol.  Limit alcohol intake to no more than 1 drink per day for nonpregnant women. One drink equals 12 ounces of beer, 5 ounces of wine, or 1 ounces of hard liquor.  Do not use street drugs.  Do not share needles.  Ask your health care provider for help if  you need support or information about quitting drugs.  Tell your health care provider if you often feel depressed.  Tell your health care provider if you have ever been abused or do not feel safe at home.   This information is not intended to replace advice given to you by your health care provider. Make sure you discuss any questions you have with your health care provider.   Document Released: 03/04/2011 Document Revised: 09/09/2014 Document Reviewed: 07/21/2013 Elsevier Interactive Patient Education [employer].

## 2016-04-18 LAB — FOLLICLE STIMULATING HORMONE: FSH: 11.1 m[IU]/mL

## 2016-04-18 LAB — PAP, TP IMAGING W/ HPV RNA, RFLX HPV TYPE 16,18/45: HPV mRNA, High Risk: NOT DETECTED

## 2016-10-11 ENCOUNTER — Telehealth: Payer: Self-pay

## 2016-10-11 MED ORDER — MEDROXYPROGESTERONE ACETATE 10 MG PO TABS: ORAL_TABLET | ORAL | 2 refills | Status: DC

## 2016-10-11 NOTE — Telephone Encounter (Signed)
Patient called requesting her 90 day supply Provera Rx be sent to [facility], her new mail order pharmacy. Rx sent. Patient informed.

## 2016-11-11 ENCOUNTER — Encounter: Payer: Self-pay | Admitting: Women's Health

## 2016-11-15 ENCOUNTER — Encounter: Payer: Self-pay | Admitting: Women's Health

## 2017-04-18 ENCOUNTER — Encounter: Payer: BLUE CROSS/BLUE SHIELD | Admitting: Women's Health

## 2017-04-21 ENCOUNTER — Encounter: Payer: Self-pay | Admitting: Women's Health

## 2017-04-21 ENCOUNTER — Ambulatory Visit (INDEPENDENT_AMBULATORY_CARE_PROVIDER_SITE_OTHER): Payer: BLUE CROSS/BLUE SHIELD | Admitting: Women's Health

## 2017-04-21 VITALS — BP 124/80 | Ht 66.0 in | Wt 169.0 lb

## 2017-04-21 DIAGNOSIS — E559 Vitamin D deficiency, unspecified: Secondary | ICD-10-CM | POA: Diagnosis not present

## 2017-04-21 DIAGNOSIS — Z13220 Encounter for screening for lipoid disorders: Secondary | ICD-10-CM

## 2017-04-21 DIAGNOSIS — N951 Menopausal and female climacteric states: Secondary | ICD-10-CM | POA: Diagnosis not present

## 2017-04-21 DIAGNOSIS — Z01419 Encounter for gynecological examination (general) (routine) without abnormal findings: Secondary | ICD-10-CM

## 2017-04-21 DIAGNOSIS — B009 Herpesviral infection, unspecified: Secondary | ICD-10-CM | POA: Diagnosis not present

## 2017-04-21 LAB — CBC WITH DIFFERENTIAL/PLATELET
BASOS ABS: 62 {cells}/uL (ref 0–200)
Basophils Relative: 1 %
EOS PCT: 1 %
Eosinophils Absolute: 62 cells/uL (ref 15–500)
HCT: 46 % — ABNORMAL HIGH (ref 35.0–45.0)
HEMOGLOBIN: 15.7 g/dL — AB (ref 11.7–15.5)
LYMPHS PCT: 17 %
Lymphs Abs: 1054 cells/uL (ref 850–3900)
MCH: 32.8 pg (ref 27.0–33.0)
MCHC: 34.1 g/dL (ref 32.0–36.0)
MCV: 96.2 fL (ref 80.0–100.0)
MONOS PCT: 10 %
MPV: 10.2 fL (ref 7.5–12.5)
Monocytes Absolute: 620 cells/uL (ref 200–950)
Neutro Abs: 4402 cells/uL (ref 1500–7800)
Neutrophils Relative %: 71 %
PLATELETS: 276 10*3/uL (ref 140–400)
RBC: 4.78 MIL/uL (ref 3.80–5.10)
RDW: 13 % (ref 11.0–15.0)
WBC: 6.2 10*3/uL (ref 3.8–10.8)

## 2017-04-21 MED ORDER — MEDROXYPROGESTERONE ACETATE 10 MG PO TABS: ORAL_TABLET | ORAL | 4 refills | Status: DC
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
## 2017-04-21 NOTE — Patient Instructions (Signed)
Health Maintenance for Postmenopausal Women Menopause is a normal process in which your reproductive ability comes to an end. This process happens gradually over a span of months to years, usually between the ages of 22 and 9. Menopause is complete when you have missed 12 consecutive menstrual periods. It is important to talk with your health care provider about some of the most common conditions that affect postmenopausal women, such as heart disease, cancer, and bone loss (osteoporosis). Adopting a healthy lifestyle and getting preventive care can help to promote your health and wellness. Those actions can also lower your chances of developing some of these common conditions. What should I know about menopause? During menopause, you may experience a number of symptoms, such as:  Moderate-to-severe hot flashes.  Night sweats.  Decrease in sex drive.  Mood swings.  Headaches.  Tiredness.  Irritability.  Memory problems.  Insomnia.  Choosing to treat or not to treat menopausal changes is an individual decision that you make with your health care provider. What should I know about hormone replacement therapy and supplements? Hormone therapy products are effective for treating symptoms that are associated with menopause, such as hot flashes and night sweats. Hormone replacement carries certain risks, especially as you become older. If you are thinking about using estrogen or estrogen with progestin treatments, discuss the benefits and risks with your health care provider. What should I know about heart disease and stroke? Heart disease, heart attack, and stroke become more likely as you age. This may be due, in part, to the hormonal changes that your body experiences during menopause. These can affect how your body processes dietary fats, triglycerides, and cholesterol. Heart attack and stroke are both medical emergencies. There are many things that you can do to help prevent heart disease  and stroke:  Have your blood pressure checked at least every 1-2 years. High blood pressure causes heart disease and increases the risk of stroke.  If you are 53-22 years old, ask your health care provider if you should take aspirin to prevent a heart attack or a stroke.  Do not use any tobacco products, including cigarettes, chewing tobacco, or electronic cigarettes. If you need help quitting, ask your health care provider.  It is important to eat a healthy diet and maintain a healthy weight. ? Be sure to include plenty of vegetables, fruits, low-fat dairy products, and lean protein. ? Avoid eating foods that are high in solid fats, added sugars, or salt (sodium).  Get regular exercise. This is one of the most important things that you can do for your health. ? Try to exercise for at least 150 minutes each week. The type of exercise that you do should increase your heart rate and make you sweat. This is known as moderate-intensity exercise. ? Try to do strengthening exercises at least twice each week. Do these in addition to the moderate-intensity exercise.  Know your numbers.Ask your health care provider to check your cholesterol and your blood glucose. Continue to have your blood tested as directed by your health care provider.  What should I know about cancer screening? There are several types of cancer. Take the following steps to reduce your risk and to catch any cancer development as early as possible. Breast Cancer  Practice breast self-awareness. ? This means understanding how your breasts normally appear and feel. ? It also means doing regular breast self-exams. Let your health care provider know about any changes, no matter how small.  If you are 40  or older, have a clinician do a breast exam (clinical breast exam or CBE) every year. Depending on your age, family history, and medical history, it may be recommended that you also have a yearly breast X-ray (mammogram).  If you  have a family history of breast cancer, talk with your health care provider about genetic screening.  If you are at high risk for breast cancer, talk with your health care provider about having an MRI and a mammogram every year.  Breast cancer (BRCA) gene test is recommended for women who have family members with BRCA-related cancers. Results of the assessment will determine the need for genetic counseling and BRCA1 and for BRCA2 testing. BRCA-related cancers include these types: ? Breast. This occurs in males or females. ? Ovarian. ? Tubal. This may also be called fallopian tube cancer. ? Cancer of the abdominal or pelvic lining (peritoneal cancer). ? Prostate. ? Pancreatic.  Cervical, Uterine, and Ovarian Cancer Your health care provider may recommend that you be screened regularly for cancer of the pelvic organs. These include your ovaries, uterus, and vagina. This screening involves a pelvic exam, which includes checking for microscopic changes to the surface of your cervix (Pap test).  For women ages 21-65, health care providers may recommend a pelvic exam and a Pap test every three years. For women ages 79-65, they may recommend the Pap test and pelvic exam, combined with testing for human papilloma virus (HPV), every five years. Some types of HPV increase your risk of cervical cancer. Testing for HPV may also be done on women of any age who have unclear Pap test results.  Other health care providers may not recommend any screening for nonpregnant women who are considered low risk for pelvic cancer and have no symptoms. Ask your health care provider if a screening pelvic exam is right for you.  If you have had past treatment for cervical cancer or a condition that could lead to cancer, you need Pap tests and screening for cancer for at least 20 years after your treatment. If Pap tests have been discontinued for you, your risk factors (such as having a new sexual partner) need to be  reassessed to determine if you should start having screenings again. Some women have medical problems that increase the chance of getting cervical cancer. In these cases, your health care provider may recommend that you have screening and Pap tests more often.  If you have a family history of uterine cancer or ovarian cancer, talk with your health care provider about genetic screening.  If you have vaginal bleeding after reaching menopause, tell your health care provider.  There are currently no reliable tests available to screen for ovarian cancer.  Lung Cancer Lung cancer screening is recommended for adults 69-62 years old who are at high risk for lung cancer because of a history of smoking. A yearly low-dose CT scan of the lungs is recommended if you:  Currently smoke.  Have a history of at least 30 pack-years of smoking and you currently smoke or have quit within the past 15 years. A pack-year is smoking an average of one pack of cigarettes per day for one year.  Yearly screening should:  Continue until it has been 15 years since you quit.  Stop if you develop a health problem that would prevent you from having lung cancer treatment.  Colorectal Cancer  This type of cancer can be detected and can often be prevented.  Routine colorectal cancer screening usually begins at  age 42 and continues through age 45.  If you have risk factors for colon cancer, your health care provider may recommend that you be screened at an earlier age.  If you have a family history of colorectal cancer, talk with your health care provider about genetic screening.  Your health care provider may also recommend using home test kits to check for hidden blood in your stool.  A small camera at the end of a tube can be used to examine your colon directly (sigmoidoscopy or colonoscopy). This is done to check for the earliest forms of colorectal cancer.  Direct examination of the colon should be repeated every  5-10 years until age 71. However, if early forms of precancerous polyps or small growths are found or if you have a family history or genetic risk for colorectal cancer, you may need to be screened more often.  Skin Cancer  Check your skin from head to toe regularly.  Monitor any moles. Be sure to tell your health care provider: ? About any new moles or changes in moles, especially if there is a change in a mole's shape or color. ? If you have a mole that is larger than the size of a pencil eraser.  If any of your family members has a history of skin cancer, especially at a young age, talk with your health care provider about genetic screening.  Always use sunscreen. Apply sunscreen liberally and repeatedly throughout the day.  Whenever you are outside, protect yourself by wearing long sleeves, pants, a wide-brimmed hat, and sunglasses.  What should I know about osteoporosis? Osteoporosis is a condition in which bone destruction happens more quickly than new bone creation. After menopause, you may be at an increased risk for osteoporosis. To help prevent osteoporosis or the bone fractures that can happen because of osteoporosis, the following is recommended:  If you are 46-71 years old, get at least 1,000 mg of calcium and at least 600 mg of vitamin D per day.  If you are older than age 55 but younger than age 65, get at least 1,200 mg of calcium and at least 600 mg of vitamin D per day.  If you are older than age 54, get at least 1,200 mg of calcium and at least 800 mg of vitamin D per day.  Smoking and excessive alcohol intake increase the risk of osteoporosis. Eat foods that are rich in calcium and vitamin D, and do weight-bearing exercises several times each week as directed by your health care provider. What should I know about how menopause affects my mental health? Depression may occur at any age, but it is more common as you become older. Common symptoms of depression  include:  Low or sad mood.  Changes in sleep patterns.  Changes in appetite or eating patterns.  Feeling an overall lack of motivation or enjoyment of activities that you previously enjoyed.  Frequent crying spells.  Talk with your health care provider if you think that you are experiencing depression. What should I know about immunizations? It is important that you get and maintain your immunizations. These include:  Tetanus, diphtheria, and pertussis (Tdap) booster vaccine.  Influenza every year before the flu season begins.  Pneumonia vaccine.  Shingles vaccine.  Your health care provider may also recommend other immunizations. This information is not intended to replace advice given to you by your health care provider. Make sure you discuss any questions you have with your health care provider. Document Released: 10/11/2005  Document Revised: 03/08/2016 Document Reviewed: 05/23/2015 Elsevier Interactive Patient Education  2018 [employer] Inc.  

## 2017-04-22 LAB — COMPREHENSIVE METABOLIC PANEL
ALBUMIN: 4.8 g/dL (ref 3.6–5.1)
ALT: 24 U/L (ref 6–29)
AST: 25 U/L (ref 10–35)
Alkaline Phosphatase: 70 U/L (ref 33–130)
BUN: 11 mg/dL (ref 7–25)
CHLORIDE: 101 mmol/L (ref 98–110)
CO2: 23 mmol/L (ref 20–32)
CREATININE: 0.9 mg/dL (ref 0.50–1.05)
Calcium: 9.9 mg/dL (ref 8.6–10.4)
Glucose, Bld: 109 mg/dL — ABNORMAL HIGH (ref 65–99)
Potassium: 4.3 mmol/L (ref 3.5–5.3)
SODIUM: 139 mmol/L (ref 135–146)
Total Bilirubin: 0.8 mg/dL (ref 0.2–1.2)
Total Protein: 7.3 g/dL (ref 6.1–8.1)

## 2017-04-22 LAB — LIPID PANEL
Cholesterol: 161 mg/dL (ref <200)
HDL: 75 mg/dL (ref >50)
LDL CALC: 75 mg/dL (ref <100)
TRIGLYCERIDES: 55 mg/dL (ref <150)
Total CHOL/HDL Ratio: 2.1 Ratio (ref <5.0)
VLDL: 11 mg/dL (ref <30)

## 2017-04-22 LAB — FOLLICLE STIMULATING HORMONE: FSH: 5.9 m[IU]/mL

## 2017-04-22 LAB — VITAMIN D 25 HYDROXY (VIT D DEFICIENCY, FRACTURES): Vit D, 25-Hydroxy: 56 ng/mL (ref 30–100)

## 2017-04-25 ENCOUNTER — Telehealth: Payer: Self-pay | Admitting: *Deleted

## 2017-04-25 MED ORDER — MEDROXYPROGESTERONE ACETATE 10 MG PO TABS: ORAL_TABLET | ORAL | 4 refills | Status: DC

## 2017-04-25 NOTE — Telephone Encounter (Signed)
Pt called stating she told us wrong pharmacy for provera Rx on 04/21/17 Rx sent to correct pharmacy

## 2018-04-28 ENCOUNTER — Encounter: Payer: Self-pay | Admitting: Women's Health

## 2018-04-28 ENCOUNTER — Ambulatory Visit (INDEPENDENT_AMBULATORY_CARE_PROVIDER_SITE_OTHER): Payer: No Typology Code available for payment source | Admitting: Women's Health

## 2018-04-28 VITALS — BP 118/80 | Ht 66.0 in | Wt 141.0 lb

## 2018-04-28 DIAGNOSIS — R7309 Other abnormal glucose: Secondary | ICD-10-CM | POA: Diagnosis not present

## 2018-04-28 DIAGNOSIS — Z01419 Encounter for gynecological examination (general) (routine) without abnormal findings: Secondary | ICD-10-CM | POA: Diagnosis not present

## 2018-04-28 DIAGNOSIS — R5383 Other fatigue: Secondary | ICD-10-CM | POA: Diagnosis not present

## 2018-04-28 MED ORDER — MEDROXYPROGESTERONE ACETATE 10 MG PO TABS: ORAL_TABLET | ORAL | 4 refills | Status: DC

## 2018-04-28 NOTE — Progress Notes (Signed)
Ann Hancock 10-19-1965 [id number]    History:    Presents for annual exam.  5-day light cycles after Provera, has had some irregularities this past year but has changed jobs twice.  States feels is now in a good job and last month cycle was normal.  Was having irregular cycles with menorrhagia prior to cyclic Provera.  No hot flashes during the day but does have night sweats. Partner vasectomy.  Normal Pap and mammogram history.  1992 RSO for torsion.  2018- colonoscopy.  2018 foot surgery.  HSV 1.  History of basal skin cancer.  Has corneal dystrophy.  Has lost 50 pounds over the past year and a half with weight watchers.  Past medical history, past surgical history, family history and social history were all reviewed and documented in the EPIC chart.  Works for [employer] in [employer], [employer].  Sister thyroid problems and history of drug abuse.  ROS:  A ROS was performed and pertinent positives and negatives are included.  Exam:  Vitals:   04/28/18 0804  BP: 118/80  Weight: 141 lb (64 kg)  Height: 5\' 6"  (1.676 m)   Body mass index is 22.76 kg/m.   General appearance:  Normal Thyroid:  Symmetrical, normal in size, without palpable masses or nodularity. Respiratory  Auscultation:  Clear without wheezing or rhonchi Cardiovascular  Auscultation:  Regular rate, without rubs, murmurs or gallops  Edema/varicosities:  Not grossly evident Abdominal  Soft,nontender, without masses, guarding or rebound.  Liver/spleen:  No organomegaly noted  Hernia:  None appreciated  Skin  Inspection:  Grossly normal   Breasts: Examined lying and sitting.     Right: Without masses, retractions, discharge or axillary adenopathy.     Left: Without masses, retractions, discharge or axillary adenopathy. Gentitourinary   Inguinal/mons:  Normal without inguinal adenopathy  External genitalia:  Normal  BUS/Urethra/Skene's glands:  Normal  Vagina:  Normal  Cervix:  Normal  Uterus:   normal in size,  shape and contour.  Midline and mobile  Adnexa/parametria:     Rt: Without masses or tenderness.   Lt: Without masses or tenderness.  Anus and perineum: Normal  Digital rectal exam: Normal sphincter tone without palpated masses or tenderness  Assessment/Plan:  52 y.o. MWF G0 for annual exam no complaints.  Light monthly cycle with Provera Perimenopausal HSV 1 History of  Basal skin cancer Corneal dystrophy- ophthalmologist manages  Plan: Having health screening labs at work next week.  Will check hemoglobin A1c TSH and FSH.  Pap normal 2017, new screening guidelines reviewed.  SBE's,  continue annual screening mammogram, increase regular exercise, calcium rich foods, vitamin D 2000 daily encouraged.  Congratulated on weight loss.  Provera 10 mg p.o. daily for 10 days each month.  Reviewed may stop having any withdrawal bleed.  Valtrex 500 twice daily 3 to 5 days as needed. Continue annual dermatology checks.  [place], 8:09 AM 04/28/2018

## 2018-04-28 NOTE — Patient Instructions (Signed)
Health Maintenance for Postmenopausal Women Menopause is a normal process in which your reproductive ability comes to an end. This process happens gradually over a span of months to years, usually between the ages of 22 and 9. Menopause is complete when you have missed 12 consecutive menstrual periods. It is important to talk with your health care provider about some of the most common conditions that affect postmenopausal women, such as heart disease, cancer, and bone loss (osteoporosis). Adopting a healthy lifestyle and getting preventive care can help to promote your health and wellness. Those actions can also lower your chances of developing some of these common conditions. What should I know about menopause? During menopause, you may experience a number of symptoms, such as:  Moderate-to-severe hot flashes.  Night sweats.  Decrease in sex drive.  Mood swings.  Headaches.  Tiredness.  Irritability.  Memory problems.  Insomnia.  Choosing to treat or not to treat menopausal changes is an individual decision that you make with your health care provider. What should I know about hormone replacement therapy and supplements? Hormone therapy products are effective for treating symptoms that are associated with menopause, such as hot flashes and night sweats. Hormone replacement carries certain risks, especially as you become older. If you are thinking about using estrogen or estrogen with progestin treatments, discuss the benefits and risks with your health care provider. What should I know about heart disease and stroke? Heart disease, heart attack, and stroke become more likely as you age. This may be due, in part, to the hormonal changes that your body experiences during menopause. These can affect how your body processes dietary fats, triglycerides, and cholesterol. Heart attack and stroke are both medical emergencies. There are many things that you can do to help prevent heart disease  and stroke:  Have your blood pressure checked at least every 1-2 years. High blood pressure causes heart disease and increases the risk of stroke.  If you are 53-22 years old, ask your health care provider if you should take aspirin to prevent a heart attack or a stroke.  Do not use any tobacco products, including cigarettes, chewing tobacco, or electronic cigarettes. If you need help quitting, ask your health care provider.  It is important to eat a healthy diet and maintain a healthy weight. ? Be sure to include plenty of vegetables, fruits, low-fat dairy products, and lean protein. ? Avoid eating foods that are high in solid fats, added sugars, or salt (sodium).  Get regular exercise. This is one of the most important things that you can do for your health. ? Try to exercise for at least 150 minutes each week. The type of exercise that you do should increase your heart rate and make you sweat. This is known as moderate-intensity exercise. ? Try to do strengthening exercises at least twice each week. Do these in addition to the moderate-intensity exercise.  Know your numbers.Ask your health care provider to check your cholesterol and your blood glucose. Continue to have your blood tested as directed by your health care provider.  What should I know about cancer screening? There are several types of cancer. Take the following steps to reduce your risk and to catch any cancer development as early as possible. Breast Cancer  Practice breast self-awareness. ? This means understanding how your breasts normally appear and feel. ? It also means doing regular breast self-exams. Let your health care provider know about any changes, no matter how small.  If you are 40  or older, have a clinician do a breast exam (clinical breast exam or CBE) every year. Depending on your age, family history, and medical history, it may be recommended that you also have a yearly breast X-ray (mammogram).  If you  have a family history of breast cancer, talk with your health care provider about genetic screening.  If you are at high risk for breast cancer, talk with your health care provider about having an MRI and a mammogram every year.  Breast cancer (BRCA) gene test is recommended for women who have family members with BRCA-related cancers. Results of the assessment will determine the need for genetic counseling and BRCA1 and for BRCA2 testing. BRCA-related cancers include these types: ? Breast. This occurs in males or females. ? Ovarian. ? Tubal. This may also be called fallopian tube cancer. ? Cancer of the abdominal or pelvic lining (peritoneal cancer). ? Prostate. ? Pancreatic.  Cervical, Uterine, and Ovarian Cancer Your health care provider may recommend that you be screened regularly for cancer of the pelvic organs. These include your ovaries, uterus, and vagina. This screening involves a pelvic exam, which includes checking for microscopic changes to the surface of your cervix (Pap test).  For women ages 21-65, health care providers may recommend a pelvic exam and a Pap test every three years. For women ages 79-65, they may recommend the Pap test and pelvic exam, combined with testing for human papilloma virus (HPV), every five years. Some types of HPV increase your risk of cervical cancer. Testing for HPV may also be done on women of any age who have unclear Pap test results.  Other health care providers may not recommend any screening for nonpregnant women who are considered low risk for pelvic cancer and have no symptoms. Ask your health care provider if a screening pelvic exam is right for you.  If you have had past treatment for cervical cancer or a condition that could lead to cancer, you need Pap tests and screening for cancer for at least 20 years after your treatment. If Pap tests have been discontinued for you, your risk factors (such as having a new sexual partner) need to be  reassessed to determine if you should start having screenings again. Some women have medical problems that increase the chance of getting cervical cancer. In these cases, your health care provider may recommend that you have screening and Pap tests more often.  If you have a family history of uterine cancer or ovarian cancer, talk with your health care provider about genetic screening.  If you have vaginal bleeding after reaching menopause, tell your health care provider.  There are currently no reliable tests available to screen for ovarian cancer.  Lung Cancer Lung cancer screening is recommended for adults 69-62 years old who are at high risk for lung cancer because of a history of smoking. A yearly low-dose CT scan of the lungs is recommended if you:  Currently smoke.  Have a history of at least 30 pack-years of smoking and you currently smoke or have quit within the past 15 years. A pack-year is smoking an average of one pack of cigarettes per day for one year.  Yearly screening should:  Continue until it has been 15 years since you quit.  Stop if you develop a health problem that would prevent you from having lung cancer treatment.  Colorectal Cancer  This type of cancer can be detected and can often be prevented.  Routine colorectal cancer screening usually begins at  age 42 and continues through age 45.  If you have risk factors for colon cancer, your health care provider may recommend that you be screened at an earlier age.  If you have a family history of colorectal cancer, talk with your health care provider about genetic screening.  Your health care provider may also recommend using home test kits to check for hidden blood in your stool.  A small camera at the end of a tube can be used to examine your colon directly (sigmoidoscopy or colonoscopy). This is done to check for the earliest forms of colorectal cancer.  Direct examination of the colon should be repeated every  5-10 years until age 71. However, if early forms of precancerous polyps or small growths are found or if you have a family history or genetic risk for colorectal cancer, you may need to be screened more often.  Skin Cancer  Check your skin from head to toe regularly.  Monitor any moles. Be sure to tell your health care provider: ? About any new moles or changes in moles, especially if there is a change in a mole's shape or color. ? If you have a mole that is larger than the size of a pencil eraser.  If any of your family members has a history of skin cancer, especially at a young age, talk with your health care provider about genetic screening.  Always use sunscreen. Apply sunscreen liberally and repeatedly throughout the day.  Whenever you are outside, protect yourself by wearing long sleeves, pants, a wide-brimmed hat, and sunglasses.  What should I know about osteoporosis? Osteoporosis is a condition in which bone destruction happens more quickly than new bone creation. After menopause, you may be at an increased risk for osteoporosis. To help prevent osteoporosis or the bone fractures that can happen because of osteoporosis, the following is recommended:  If you are 46-71 years old, get at least 1,000 mg of calcium and at least 600 mg of vitamin D per day.  If you are older than age 55 but younger than age 65, get at least 1,200 mg of calcium and at least 600 mg of vitamin D per day.  If you are older than age 54, get at least 1,200 mg of calcium and at least 800 mg of vitamin D per day.  Smoking and excessive alcohol intake increase the risk of osteoporosis. Eat foods that are rich in calcium and vitamin D, and do weight-bearing exercises several times each week as directed by your health care provider. What should I know about how menopause affects my mental health? Depression may occur at any age, but it is more common as you become older. Common symptoms of depression  include:  Low or sad mood.  Changes in sleep patterns.  Changes in appetite or eating patterns.  Feeling an overall lack of motivation or enjoyment of activities that you previously enjoyed.  Frequent crying spells.  Talk with your health care provider if you think that you are experiencing depression. What should I know about immunizations? It is important that you get and maintain your immunizations. These include:  Tetanus, diphtheria, and pertussis (Tdap) booster vaccine.  Influenza every year before the flu season begins.  Pneumonia vaccine.  Shingles vaccine.  Your health care provider may also recommend other immunizations. This information is not intended to replace advice given to you by your health care provider. Make sure you discuss any questions you have with your health care provider. Document Released: 10/11/2005  Document Revised: 03/08/2016 Document Reviewed: 05/23/2015 Elsevier Interactive Patient Education  2018 [employer] Inc.  

## 2018-04-29 LAB — URINALYSIS, COMPLETE W/RFL CULTURE
BILIRUBIN URINE: NEGATIVE
Bacteria, UA: NONE SEEN /HPF
GLUCOSE, UA: NEGATIVE
Hgb urine dipstick: NEGATIVE
Hyaline Cast: NONE SEEN /LPF
KETONES UR: NEGATIVE
LEUKOCYTE ESTERASE: NEGATIVE
NITRITES URINE, INITIAL: NEGATIVE
PH: 7.5 (ref 5.0–8.0)
Protein, ur: NEGATIVE
RBC / HPF: NONE SEEN /HPF (ref 0–2)
SPECIFIC GRAVITY, URINE: 1.011 (ref 1.001–1.03)
WBC, UA: NONE SEEN /HPF (ref 0–5)

## 2018-04-29 LAB — HEMOGLOBIN A1C
EAG (MMOL/L): 5.2 (calc)
HEMOGLOBIN A1C: 4.9 %{Hb} (ref <5.7)
MEAN PLASMA GLUCOSE: 94 (calc)

## 2018-04-29 LAB — FOLLICLE STIMULATING HORMONE: FSH: 16.3 m[IU]/mL

## 2018-04-29 LAB — TSH: TSH: 1.65 m[IU]/L

## 2018-04-29 LAB — NO CULTURE INDICATED

## 2018-05-30 ENCOUNTER — Other Ambulatory Visit: Payer: Self-pay | Admitting: Women's Health

## 2018-06-18 ENCOUNTER — Other Ambulatory Visit: Payer: Self-pay | Admitting: Women's Health

## 2018-06-18 DIAGNOSIS — B009 Herpesviral infection, unspecified: Secondary | ICD-10-CM

## 2018-06-18 MED ORDER — VALACYCLOVIR HCL 500 MG PO TABS: ORAL_TABLET | ORAL | 4 refills | Status: DC

## 2019-01-21 ENCOUNTER — Other Ambulatory Visit: Payer: Self-pay | Admitting: Women's Health

## 2019-02-18 ENCOUNTER — Encounter: Payer: Self-pay | Admitting: Women's Health

## 2019-04-23 ENCOUNTER — Other Ambulatory Visit: Payer: Self-pay | Admitting: Women's Health

## 2019-05-04 ENCOUNTER — Encounter: Payer: No Typology Code available for payment source | Admitting: Women's Health

## 2019-05-28 ENCOUNTER — Other Ambulatory Visit: Payer: Self-pay

## 2019-05-31 ENCOUNTER — Encounter: Payer: Self-pay | Admitting: Women's Health

## 2019-05-31 ENCOUNTER — Other Ambulatory Visit: Payer: Self-pay

## 2019-05-31 ENCOUNTER — Ambulatory Visit (INDEPENDENT_AMBULATORY_CARE_PROVIDER_SITE_OTHER): Payer: PRIVATE HEALTH INSURANCE | Admitting: Women's Health

## 2019-05-31 VITALS — BP 120/80 | Ht 66.0 in | Wt 151.0 lb

## 2019-05-31 DIAGNOSIS — B009 Herpesviral infection, unspecified: Secondary | ICD-10-CM

## 2019-05-31 DIAGNOSIS — Z01419 Encounter for gynecological examination (general) (routine) without abnormal findings: Secondary | ICD-10-CM | POA: Diagnosis not present

## 2019-05-31 DIAGNOSIS — Z13220 Encounter for screening for lipoid disorders: Secondary | ICD-10-CM

## 2019-05-31 MED ORDER — VALACYCLOVIR HCL 500 MG PO TABS: ORAL_TABLET | ORAL | 4 refills | Status: AC

## 2019-05-31 MED ORDER — ALPRAZOLAM 0.25 MG PO TABS: 0.2500 mg | ORAL_TABLET | Freq: Every evening | ORAL | 0 refills | Status: AC | PRN

## 2019-05-31 MED ORDER — NORETHINDRONE 0.35 MG PO TABS: 1.0000 | ORAL_TABLET | Freq: Every day | ORAL | 4 refills | Status: DC
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
## 2019-05-31 NOTE — Patient Instructions (Signed)
Vit D 2000 iu   Health Maintenance, Female Adopting a healthy lifestyle and getting preventive care are important in promoting health and wellness. Ask your health care provider about:  The right schedule for you to have regular tests and exams.  Things you can do on your own to prevent diseases and keep yourself healthy. What should I know about diet, weight, and exercise? Eat a healthy diet   Eat a diet that includes plenty of vegetables, fruits, low-fat dairy products, and lean protein.  Do not eat a lot of foods that are high in solid fats, added sugars, or sodium. Maintain a healthy weight Body mass index (BMI) is used to identify weight problems. It estimates body fat based on height and weight. Your health care provider can help determine your BMI and help you achieve or maintain a healthy weight. Get regular exercise Get regular exercise. This is one of the most important things you can do for your health. Most adults should:  Exercise for at least 150 minutes each week. The exercise should increase your heart rate and make you sweat (moderate-intensity exercise).  Do strengthening exercises at least twice a week. This is in addition to the moderate-intensity exercise.  Spend less time sitting. Even light physical activity can be beneficial. Watch cholesterol and blood lipids Have your blood tested for lipids and cholesterol at 52 years of age, then have this test every 5 years. Have your cholesterol levels checked more often if:  Your lipid or cholesterol levels are high.  You are older than 52 years of age.  You are at high risk for heart disease. What should I know about cancer screening? Depending on your health history and family history, you may need to have cancer screening at various ages. This may include screening for:  Breast cancer.  Cervical cancer.  Colorectal cancer.  Skin cancer.  Lung cancer. What should I know about heart disease, diabetes, and  high blood pressure? Blood pressure and heart disease  High blood pressure causes heart disease and increases the risk of stroke. This is more likely to develop in people who have high blood pressure readings, are of African descent, or are overweight.  Have your blood pressure checked: ? Every 3-5 years if you are 79-50 years of age. ? Every year if you are 87 years old or older. Diabetes Have regular diabetes screenings. This checks your fasting blood sugar level. Have the screening done:  Once every three years after age 57 if you are at a normal weight and have a low risk for diabetes.  More often and at a younger age if you are overweight or have a high risk for diabetes. What should I know about preventing infection? Hepatitis B If you have a higher risk for hepatitis B, you should be screened for this virus. Talk with your health care provider to find out if you are at risk for hepatitis B infection. Hepatitis C Testing is recommended for:  Everyone born from [birth year] through 1964.  Anyone with known risk factors for hepatitis C. Sexually transmitted infections (STIs)  Get screened for STIs, including gonorrhea and chlamydia, if: ? You are sexually active and are younger than 52 years of age. ? You are older than 52 years of age and your health care provider tells you that you are at risk for this type of infection. ? Your sexual activity has changed since you were last screened, and you are at increased risk for chlamydia or gonorrhea.  Ask your health care provider if you are at risk.  Ask your health care provider about whether you are at high risk for HIV. Your health care provider may recommend a prescription medicine to help prevent HIV infection. If you choose to take medicine to prevent HIV, you should first get tested for HIV. You should then be tested every 3 months for as long as you are taking the medicine. Pregnancy  If you are about to stop having your period  (premenopausal) and you may become pregnant, seek counseling before you get pregnant.  Take 400 to 800 micrograms (mcg) of folic acid every day if you become pregnant.  Ask for birth control (contraception) if you want to prevent pregnancy. Osteoporosis and menopause Osteoporosis is a disease in which the bones lose minerals and strength with aging. This can result in bone fractures. If you are 65 years old or older, or if you are at risk for osteoporosis and fractures, ask your health care provider if you should:  Be screened for bone loss.  Take a calcium or vitamin D supplement to lower your risk of fractures.  Be given hormone replacement therapy (HRT) to treat symptoms of menopause. Follow these instructions at home: Lifestyle  Do not use any products that contain nicotine or tobacco, such as cigarettes, e-cigarettes, and chewing tobacco. If you need help quitting, ask your health care provider.  Do not use street drugs.  Do not share needles.  Ask your health care provider for help if you need support or information about quitting drugs. Alcohol use  Do not drink alcohol if: ? Your health care provider tells you not to drink. ? You are pregnant, may be pregnant, or are planning to become pregnant.  If you drink alcohol: ? Limit how much you use to 0-1 drink a day. ? Limit intake if you are breastfeeding.  Be aware of how much alcohol is in your drink. In the [place] one drink equals one 12 oz bottle of beer (355 mL), one 5 oz glass of wine (148 mL), or one 1 oz glass of hard liquor (44 mL). General instructions  Schedule regular health, dental, and eye exams.  Stay current with your vaccines.  Tell your health care provider if: ? You often feel depressed. ? You have ever been abused or do not feel safe at home. Summary  Adopting a healthy lifestyle and getting preventive care are important in promoting health and wellness.  Follow your health care provider's  instructions about healthy diet, exercising, and getting tested or screened for diseases.  Follow your health care provider's instructions on monitoring your cholesterol and blood pressure. This information is not intended to replace advice given to you by your health care provider. Make sure you discuss any questions you have with your health care provider. Document Released: 03/04/2011 Document Revised: 08/12/2018 Document Reviewed: 08/12/2018 Elsevier Patient Education  2020 [employer] Inc[employer]  

## 2019-05-31 NOTE — Progress Notes (Signed)
l °

## 2019-06-01 LAB — COMPREHENSIVE METABOLIC PANEL
AG Ratio: 2 (calc) (ref 1.0–2.5)
ALT: 23 U/L (ref 6–29)
AST: 23 U/L (ref 10–35)
Albumin: 4.6 g/dL (ref 3.6–5.1)
Alkaline phosphatase (APISO): 48 U/L (ref 37–153)
BUN: 17 mg/dL (ref 7–25)
CO2: 26 mmol/L (ref 20–32)
Calcium: 9.2 mg/dL (ref 8.6–10.4)
Chloride: 101 mmol/L (ref 98–110)
Creat: 0.75 mg/dL (ref 0.50–1.05)
Globulin: 2.3 g/dL (calc) (ref 1.9–3.7)
Glucose, Bld: 92 mg/dL (ref 65–99)
Potassium: 3.9 mmol/L (ref 3.5–5.3)
Sodium: 138 mmol/L (ref 135–146)
Total Bilirubin: 0.8 mg/dL (ref 0.2–1.2)
Total Protein: 6.9 g/dL (ref 6.1–8.1)

## 2019-06-01 LAB — CBC WITH DIFFERENTIAL/PLATELET
Absolute Monocytes: 766 cells/uL (ref 200–950)
Basophils Absolute: 69 cells/uL (ref 0–200)
Basophils Relative: 1 %
Eosinophils Absolute: 69 cells/uL (ref 15–500)
Eosinophils Relative: 1 %
HCT: 41.4 % (ref 35.0–45.0)
Hemoglobin: 14.2 g/dL (ref 11.7–15.5)
Lymphs Abs: 1014 cells/uL (ref 850–3900)
MCH: 32.9 pg (ref 27.0–33.0)
MCHC: 34.3 g/dL (ref 32.0–36.0)
MCV: 96.1 fL (ref 80.0–100.0)
MPV: 10.4 fL (ref 7.5–12.5)
Monocytes Relative: 11.1 %
Neutro Abs: 4982 cells/uL (ref 1500–7800)
Neutrophils Relative %: 72.2 %
Platelets: 232 10*3/uL (ref 140–400)
RBC: 4.31 10*6/uL (ref 3.80–5.10)
RDW: 11.8 % (ref 11.0–15.0)
Total Lymphocyte: 14.7 %
WBC: 6.9 10*3/uL (ref 3.8–10.8)

## 2019-06-01 LAB — URINALYSIS, COMPLETE W/RFL CULTURE
Bilirubin Urine: NEGATIVE
Glucose, UA: NEGATIVE
Hgb urine dipstick: NEGATIVE
Hyaline Cast: NONE SEEN /LPF
Ketones, ur: NEGATIVE
Leukocyte Esterase: NEGATIVE
Nitrites, Initial: NEGATIVE
Protein, ur: NEGATIVE
RBC / HPF: NONE SEEN /HPF (ref 0–2)
Specific Gravity, Urine: 1.018 (ref 1.001–1.03)
pH: 6.5 (ref 5.0–8.0)

## 2019-06-01 LAB — LIPID PANEL
Cholesterol: 167 mg/dL (ref <200)
HDL: 91 mg/dL (ref >=50)
LDL Cholesterol (Calc): 63 mg/dL (calc)
Non-HDL Cholesterol (Calc): 76 mg/dL (calc) (ref <130)
Total CHOL/HDL Ratio: 1.8 (calc) (ref <5.0)
Triglycerides: 54 mg/dL (ref <150)

## 2019-06-01 LAB — CULTURE INDICATED

## 2019-06-03 LAB — PAP, TP IMAGING W/ HPV RNA, RFLX HPV TYPE 16,18/45: HPV DNA High Risk: NOT DETECTED

## 2019-06-21 ENCOUNTER — Other Ambulatory Visit: Payer: Self-pay | Admitting: Women's Health

## 2019-06-21 ENCOUNTER — Encounter: Payer: Self-pay | Admitting: Women's Health

## 2019-06-21 MED ORDER — MEDROXYPROGESTERONE ACETATE 10 MG PO TABS: ORAL_TABLET | ORAL | 4 refills | Status: AC

## 2020-02-21 ENCOUNTER — Encounter: Payer: Self-pay | Admitting: Women's Health

## 2020-05-31 ENCOUNTER — Encounter: Payer: PRIVATE HEALTH INSURANCE | Admitting: Nurse Practitioner
